# Patient Record
Sex: Female | Born: 1970 | Race: Black or African American | Hispanic: No | Smoking: Current every day smoker
Health system: Southern US, Community
[De-identification: ages and names within clinical notes are randomized; demographics above are authoritative.]

## PROBLEM LIST (undated history)

## (undated) DIAGNOSIS — I509 Heart failure, unspecified: Secondary | ICD-10-CM

## (undated) HISTORY — PX: CARDIAC SURGERY: SHX584

## (undated) HISTORY — PX: THYROIDECTOMY: SHX17

## (undated) HISTORY — PX: ABDOMINAL HYSTERECTOMY: SHX81

---

## 2010-04-04 DIAGNOSIS — I10 Essential (primary) hypertension: Secondary | ICD-10-CM | POA: Insufficient documentation

## 2010-04-04 HISTORY — DX: Essential (primary) hypertension: I10

## 2011-12-30 DIAGNOSIS — M8580 Other specified disorders of bone density and structure, unspecified site: Secondary | ICD-10-CM

## 2011-12-30 HISTORY — DX: Other specified disorders of bone density and structure, unspecified site: M85.80

## 2012-04-03 DIAGNOSIS — H349 Unspecified retinal vascular occlusion: Secondary | ICD-10-CM | POA: Insufficient documentation

## 2012-04-03 HISTORY — DX: Unspecified retinal vascular occlusion: H34.9

## 2014-07-14 DIAGNOSIS — I482 Chronic atrial fibrillation, unspecified: Secondary | ICD-10-CM | POA: Insufficient documentation

## 2014-07-14 HISTORY — DX: Chronic atrial fibrillation, unspecified: I48.20

## 2015-04-14 DIAGNOSIS — I428 Other cardiomyopathies: Secondary | ICD-10-CM | POA: Insufficient documentation

## 2015-04-14 HISTORY — DX: Other cardiomyopathies: I42.8

## 2019-01-21 DIAGNOSIS — M3214 Glomerular disease in systemic lupus erythematosus: Secondary | ICD-10-CM | POA: Insufficient documentation

## 2019-01-21 DIAGNOSIS — Z992 Dependence on renal dialysis: Secondary | ICD-10-CM | POA: Insufficient documentation

## 2019-01-21 DIAGNOSIS — N2581 Secondary hyperparathyroidism of renal origin: Secondary | ICD-10-CM

## 2019-01-21 DIAGNOSIS — L930 Discoid lupus erythematosus: Secondary | ICD-10-CM | POA: Insufficient documentation

## 2019-01-21 DIAGNOSIS — R52 Pain, unspecified: Secondary | ICD-10-CM

## 2019-01-21 DIAGNOSIS — L299 Pruritus, unspecified: Secondary | ICD-10-CM | POA: Insufficient documentation

## 2019-01-21 DIAGNOSIS — E559 Vitamin D deficiency, unspecified: Secondary | ICD-10-CM

## 2019-01-21 DIAGNOSIS — R0600 Dyspnea, unspecified: Secondary | ICD-10-CM | POA: Insufficient documentation

## 2019-01-21 DIAGNOSIS — N186 End stage renal disease: Secondary | ICD-10-CM

## 2019-01-21 DIAGNOSIS — I25810 Atherosclerosis of coronary artery bypass graft(s) without angina pectoris: Secondary | ICD-10-CM

## 2019-01-21 DIAGNOSIS — R197 Diarrhea, unspecified: Secondary | ICD-10-CM | POA: Insufficient documentation

## 2019-01-21 DIAGNOSIS — D631 Anemia in chronic kidney disease: Secondary | ICD-10-CM | POA: Insufficient documentation

## 2019-01-21 DIAGNOSIS — D509 Iron deficiency anemia, unspecified: Secondary | ICD-10-CM

## 2019-01-21 DIAGNOSIS — N250 Renal osteodystrophy: Secondary | ICD-10-CM | POA: Insufficient documentation

## 2019-01-21 DIAGNOSIS — I120 Hypertensive chronic kidney disease with stage 5 chronic kidney disease or end stage renal disease: Secondary | ICD-10-CM | POA: Insufficient documentation

## 2019-01-21 DIAGNOSIS — N189 Chronic kidney disease, unspecified: Secondary | ICD-10-CM | POA: Insufficient documentation

## 2019-01-21 HISTORY — DX: Pain, unspecified: R52

## 2019-01-21 HISTORY — DX: Secondary hyperparathyroidism of renal origin: N25.81

## 2019-01-21 HISTORY — DX: Pruritus, unspecified: L29.9

## 2019-01-21 HISTORY — DX: Renal osteodystrophy: N25.0

## 2019-01-21 HISTORY — DX: Disorder of phosphorus metabolism, unspecified: E83.30

## 2019-01-21 HISTORY — DX: Dyspnea, unspecified: R06.00

## 2019-01-21 HISTORY — DX: Discoid lupus erythematosus: L93.0

## 2019-01-21 HISTORY — DX: Dependence on renal dialysis: Z99.2

## 2019-01-21 HISTORY — DX: Vitamin D deficiency, unspecified: E55.9

## 2019-01-21 HISTORY — DX: Iron deficiency anemia, unspecified: D50.9

## 2019-01-21 HISTORY — DX: End stage renal disease: N18.6

## 2019-01-21 HISTORY — DX: Anemia in chronic kidney disease: N18.9

## 2019-01-21 HISTORY — DX: Atherosclerosis of coronary artery bypass graft(s) without angina pectoris: I25.810

## 2019-01-21 HISTORY — DX: Hypertensive chronic kidney disease with stage 5 chronic kidney disease or end stage renal disease: I12.0

## 2019-01-21 HISTORY — DX: Diarrhea, unspecified: R19.7

## 2019-01-21 HISTORY — DX: Anemia in chronic kidney disease: D63.1

## 2019-01-25 DIAGNOSIS — E875 Hyperkalemia: Secondary | ICD-10-CM | POA: Insufficient documentation

## 2019-01-25 HISTORY — DX: Hyperkalemia: E87.5

## 2019-04-01 ENCOUNTER — Ambulatory Visit: Payer: Medicare Other | Admitting: Cardiology

## 2019-04-06 ENCOUNTER — Ambulatory Visit: Payer: Medicare Other | Admitting: Cardiology

## 2019-04-13 ENCOUNTER — Encounter: Payer: Self-pay | Admitting: *Deleted

## 2019-04-13 ENCOUNTER — Ambulatory Visit (INDEPENDENT_AMBULATORY_CARE_PROVIDER_SITE_OTHER): Payer: Medicare Other | Admitting: Cardiology

## 2019-04-13 ENCOUNTER — Other Ambulatory Visit: Payer: Self-pay

## 2019-04-13 VITALS — BP 120/80 | HR 127 | Ht 69.0 in | Wt 181.0 lb

## 2019-04-13 DIAGNOSIS — M3214 Glomerular disease in systemic lupus erythematosus: Secondary | ICD-10-CM | POA: Insufficient documentation

## 2019-04-13 DIAGNOSIS — I482 Chronic atrial fibrillation, unspecified: Secondary | ICD-10-CM | POA: Diagnosis not present

## 2019-04-13 DIAGNOSIS — I10 Essential (primary) hypertension: Secondary | ICD-10-CM | POA: Diagnosis not present

## 2019-04-13 DIAGNOSIS — I050 Rheumatic mitral stenosis: Secondary | ICD-10-CM

## 2019-04-13 DIAGNOSIS — N186 End stage renal disease: Secondary | ICD-10-CM

## 2019-04-13 DIAGNOSIS — Z7901 Long term (current) use of anticoagulants: Secondary | ICD-10-CM | POA: Insufficient documentation

## 2019-04-13 HISTORY — DX: Glomerular disease in systemic lupus erythematosus: M32.14

## 2019-04-13 HISTORY — DX: Long term (current) use of anticoagulants: Z79.01

## 2019-04-13 HISTORY — DX: Rheumatic mitral stenosis: I05.0

## 2019-04-13 NOTE — Progress Notes (Signed)
Cardiology Office Note:    Date:  04/13/2019   ID:  Morgan Pope, DOB June 16, 1970, MRN [id number]  PCP:  [facility]  Cardiologist:  Berniece Salines, DO  Electrophysiologist:  None   Referring MD: Reita May, NP   Chief Complaint  Patient presents with  . Irregular Heart Beat  . Establish Care    History of Present Illness:    Morgan Pope is a 49 y.o. female with a hx of atrial fibrillation (diagnosed in 2017 patient is currently on warfarin and amiodarone 200 mg daily), nonischemic cardiomyopathy most recent ejection fraction in September 2019 was reported at rate of 55%, lupus which was diagnosed in her 31s, ESRD on hemodialysis Monday Wednesday and Fridays first dialysis was back in July 2000, hypertensive heart disease however patient is no longer antihypertensive due to significant hypotension postdialysis and now take midodrine.  The patient is here today to establish cardiovascular care.  She denies any chest pain, shortness of breath, nausea, vomiting.  She just moved to [place] and wants to establish care.  Past Medical History:  Diagnosis Date  . Anemia in chronic kidney disease 01/21/2019  . 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 Chronic atrial fibrillation ([place]) 07/14/2014  . Dependence on renal dialysis ([place]) 01/21/2019  . Diarrhea, unspecified 01/21/2019  . 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 Disorder of phosphorus metabolism, unspecified 01/21/2019  . Dyspnea, unspecified 01/21/2019  . 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 End stage renal disease ([place]) 01/21/2019  . Hyperkalemia 01/25/2019  . Hypertension 04/04/2010  . Hypertensive chronic kidney disease with stage 5 chronic kidney disease or end stage renal disease ([place]) 01/21/2019  . Iron deficiency anemia, unspecified 01/21/2019  . Nonischemic cardiomyopathy ([place]) 04/14/2015   EF 28%.  Followed by [employer].   EF improved to 50% with therapy.    . Osteopenia 12/30/2011   On bone density 9/13  . Pain, unspecified 01/21/2019  . Pruritus, unspecified 01/21/2019  . Renal osteodystrophy 01/21/2019  . Secondary hyperparathyroidism of renal origin ([place]) 01/21/2019  . SLE glomerulonephritis syndrome, WHO class VI ([place]) 04/13/2019  . Vitamin D deficiency, unspecified 01/21/2019    Past Surgical History:  Procedure Laterality Date  . ABDOMINAL HYSTERECTOMY    . CARDIAC SURGERY     Released fluid from heart  . THYROIDECTOMY      Current Medications: Current Meds  Medication Sig  . amiodarone (PACERONE) 200 MG tablet Take 200 mg by mouth 2 (two) times daily.  . calcitRIOL (ROCALTROL) 0.25 MCG capsule Take by mouth.  . Calcium Acetate 667 MG TABS TK 3 TS PO TID WITH MEALS AND TK 2 TS WITH SNACKS  . Cholecalciferol 50 MCG (2000 UT) TABS Take by mouth.  . fluticasone (FLONASE) 50 MCG/ACT nasal spray Place 2 sprays into both nostrils 2 (two) times daily.  . midodrine (PROAMATINE) 10 MG tablet Take 10 mg by mouth 3 (three) times a week.  Marland Kitchen omeprazole (PRILOSEC) 20 MG capsule Take 20 mg by mouth daily.  Marland Kitchen warfarin (COUMADIN) 5 MG tablet Take 5 mg by mouth daily.     Allergies:   Codeine, Penicillins, and Fentanyl   Social History   Socioeconomic History  . Marital status: Divorced    Spouse name: Not on file  . Number of children: Not on file  . Years of education: Not on file  . Highest education level: Not on file  Occupational History  . Not on file  Tobacco Use  .  Smoking status: Current Every Day Smoker    Types: Cigarettes  . Smokeless tobacco: Never Used  Substance and Sexual Activity  . Alcohol use: Not Currently  . Drug use: Never  . Sexual activity: Not on file  Other Topics Concern  . Not on file  Social History Narrative  . Not on file   Social Determinants of Health   Financial Resource Strain:   . Difficulty of Paying Living Expenses: Not on file  Food Insecurity:   . Worried About Charity fundraiser in  the Last Year: Not on file  . Ran Out of Food in the Last Year: Not on file  Transportation Needs:   . Lack of Transportation (Medical): Not on file  . Lack of Transportation (Non-Medical): Not on file  Physical Activity:   . Days of Exercise per Week: Not on file  . Minutes of Exercise per Session: Not on file  Stress:   . Feeling of Stress : Not on file  Social Connections:   . Frequency of Communication with Friends and Family: Not on file  . Frequency of Social Gatherings with Friends and Family: Not on file  . Attends Religious Services: Not on file  . Active Member of Clubs or Organizations: Not on file  . Attends Archivist Meetings: Not on file  . Marital Status: Not on file     Family History: The patient's family history includes Congestive Heart Failure in her mother; Dementia in her mother; Diabetes in her mother and sister; Heart attack in her brother; Renal Disease in her mother and sister.  ROS:   Review of Systems  Constitution: Negative for decreased appetite, fever and weight gain.  HENT: Negative for congestion, ear discharge, hoarse voice and sore throat.   Eyes: Negative for discharge, redness, vision loss in right eye and visual halos.  Cardiovascular: Negative for chest pain, dyspnea on exertion, leg swelling, orthopnea and palpitations.  Respiratory: Negative for cough, hemoptysis, shortness of breath and snoring.   Endocrine: Negative for heat intolerance and polyphagia.  Hematologic/Lymphatic: Negative for bleeding problem. Does not bruise/bleed easily.  Skin: Negative for flushing, nail changes, rash and suspicious lesions.  Musculoskeletal: Negative for arthritis, joint pain, muscle cramps, myalgias, neck pain and stiffness.  Gastrointestinal: Negative for abdominal pain, bowel incontinence, diarrhea and excessive appetite.  Genitourinary: Negative for decreased libido, genital sores and incomplete emptying.  Neurological: Negative for brief  paralysis, focal weakness, headaches and loss of balance.  Psychiatric/Behavioral: Negative for altered mental status, depression and suicidal ideas.  Allergic/Immunologic: Negative for HIV exposure and persistent infections.    EKGs/Labs/Other Studies Reviewed:    The following studies were reviewed today:   EKG:  The ekg ordered today demonstrates tachycardia, heart rate 127 bpm.  Echocardiogram done in 2019 reported mild concentric LVH.  Mild LV systolic function.  EF 55%.  Severely dilated left atrium.  Calcified mitral valve and MAC with mild mitral stenosis grading 4 mmHg.  Sclerotic mitral valve.  Mild aortic regurgitation.  Recent Labs: No results found for requested labs within last 8760 hours.  Recent Lipid Panel No results found for: CHOL, TRIG, HDL, CHOLHDL, VLDL, LDLCALC, LDLDIRECT  Physical Exam:    VS:  BP 120/80 (BP Location: Right Arm, Patient Position: Sitting, Cuff Size: Normal)   Pulse (!) 127   Ht _0  (1.753 m)   Wt 181 lb (82.1 kg)   SpO2 98%   BMI 26.73 kg/m     Wt Readings from  Last 3 Encounters:  04/13/19 181 lb (82.1 kg)     GEN: Well nourished, well developed in no acute distress HEENT: Normal NECK: No JVD; No carotid bruits  LYMPHATICS: No lymphadenopathy CARDIAC: S1S2 noted,RRR, no murmurs, rubs, gallops RESPIRATORY:  Clear to auscultation without rales, wheezing or rhonchi  ABDOMEN: Soft, non-tender, non-distended, +bowel sounds, no guarding. EXTREMITIES: No edema, No cyanosis, no clubbing MUSCULOSKELETAL:  No deformity  SKIN: Warm and dry NEUROLOGIC:  Alert and oriented x 3, non-focal PSYCHIATRIC:  Normal affect, good insight  ASSESSMENT:    1. Chronic atrial fibrillation ([place])   2. Hypertension, unspecified type   3. Anticoagulant long-term use   4. Mitral valve stenosis, unspecified etiology   5. ESRD (end stage renal disease) ([place])    PLAN:    1.  In effort to establish cardiovascular care we are going to get new baseline  echocardiogram especially since her 2019 echocardiogram to report notes mild mitral stenosis.   2.  She is on Coumadin for her atrial fibrillation and has not really been monitored appropriately therefore we can set the patient up with our Coumadin clinic. She is agreeable to this.   3.  No medication changes will be made at this time.  The patient is in agreement with the above plan. The patient left the office in stable condition.  The patient will follow up in 3 months or sooner if needed.  Medication Adjustments/Labs and Tests Ordered: Current medicines are reviewed at length with the patient today.  Concerns regarding medicines are outlined above.  Orders Placed This Encounter  Procedures  . Ambulatory referral to Anticoagulation Monitoring  . ECHOCARDIOGRAM COMPLETE   No orders of the defined types were placed in this encounter.   Patient Instructions  Medication Instructions:  Your physician recommends that you continue on your current medications as directed. Please refer to the Current Medication list given to you today.  *If you need a refill on your cardiac medications before your next appointment, please call your pharmacy*  Lab Work: None If you have labs (blood work) drawn today and your tests are completely normal, you will receive your results only by: Marland Kitchen MyChart Message (if you have MyChart) OR . A paper copy in the mail If you have any lab test that is abnormal or we need to change your treatment, we will call you to review the results.  Testing/Procedures: Your physician has requested that you have an echocardiogram. Echocardiography is a painless test that uses sound waves to create images of your heart. It provides your doctor with information about the size and shape of your heart and how well your heart's chambers and valves are working. This procedure takes approximately one hour. There are no restrictions for this procedure.    Follow-Up: At [facility], you and your health needs are our priority.  As part of our continuing mission to provide you with exceptional heart care, we have created designated Provider Care Teams.  These Care Teams include your primary Cardiologist (physician) and Advanced Practice Providers (APPs -  Physician Assistants and Nurse Practitioners) who all work together to provide you with the care you need, when you need it.  Your next appointment:   3 month(s)  The format for your next appointment:   In Person  Provider:   Berniece Salines, DO  Other Instructions      Adopting a Healthy Lifestyle.  Know what a healthy weight is for you (roughly BMI <25) and aim to  maintain this   Aim for 7+ servings of fruits and vegetables daily   65-80+ fluid ounces of water or unsweet tea for healthy kidneys   Limit to max 1 drink of alcohol per day; avoid smoking/tobacco   Limit animal fats in diet for cholesterol and heart health - choose grass fed whenever available   Avoid highly processed foods, and foods high in saturated/trans fats   Aim for low stress - take time to unwind and care for your mental health   Aim for 150 min of moderate intensity exercise weekly for heart health, and weights twice weekly for bone health   Aim for 7-9 hours of sleep daily   When it comes to diets, agreement about the perfect plan isnt easy to find, even among the experts. Experts at the [place] developed an idea known as the Healthy Eating Plate. Just imagine a plate divided into logical, healthy portions.   The emphasis is on diet quality:   Load up on vegetables and fruits - one-half of your plate: Aim for color and variety, and remember that potatoes dont count.   Go for whole grains - one-quarter of your plate: Whole wheat, barley, wheat berries, quinoa, oats, brown rice, and foods made with them. If you want pasta, go with whole wheat pasta.   Protein power - one-quarter of your plate:  Fish, chicken, beans, and nuts are all healthy, versatile protein sources. Limit red meat.   The diet, however, does go beyond the plate, offering a few other suggestions.   Use healthy plant oils, such as olive, canola, soy, corn, sunflower and peanut. Check the labels, and avoid partially hydrogenated oil, which have unhealthy trans fats.   If youre thirsty, drink water. Coffee and tea are good in moderation, but skip sugary drinks and limit milk and dairy products to one or two daily servings.   The type of carbohydrate in the diet is more important than the amount. Some sources of carbohydrates, such as vegetables, fruits, whole grains, and beans-are healthier than others.   Finally, stay active  Signed, Berniece Salines, DO  04/13/2019 11:09 AM    [place]

## 2019-04-13 NOTE — Addendum Note (Signed)
Addended by: Particia Nearing B on: 04/13/2019 01:52 PM   Modules accepted: Orders

## 2019-04-13 NOTE — Patient Instructions (Addendum)
Medication Instructions:  Your physician recommends that you continue on your current medications as directed. Please refer to the Current Medication list given to you today.  *If you need a refill on your cardiac medications before your next appointment, please call your pharmacy*  Lab Work: None If you have labs (blood work) drawn today and your tests are completely normal, you will receive your results only by: Marland Kitchen MyChart Message (if you have MyChart) OR . A paper copy in the mail If you have any lab test that is abnormal or we need to change your treatment, we will call you to review the results.  Testing/Procedures: Your physician has requested that you have an echocardiogram. Echocardiography is a painless test that uses sound waves to create images of your heart. It provides your doctor with information about the size and shape of your heart and how well your heart's chambers and valves are working. This procedure takes approximately one hour. There are no restrictions for this procedure.    Follow-Up: At [facility], you and your health needs are our priority.  As part of our continuing mission to provide you with exceptional heart care, we have created designated Provider Care Teams.  These Care Teams include your primary Cardiologist (physician) and Advanced Practice Providers (APPs -  Physician Assistants and Nurse Practitioners) who all work together to provide you with the care you need, when you need it.  Your next appointment:   3 month(s)  The format for your next appointment:   In Person  Provider:   Berniece Salines, DO  Other Instructions You are being referred to the Coumadin clinic. They will contact you with an appointment date and time.

## 2019-04-23 ENCOUNTER — Emergency Department (HOSPITAL_COMMUNITY): Payer: Medicare Other

## 2019-04-23 ENCOUNTER — Encounter (HOSPITAL_COMMUNITY): Payer: Self-pay | Admitting: *Deleted

## 2019-04-23 ENCOUNTER — Other Ambulatory Visit: Payer: Self-pay

## 2019-04-23 ENCOUNTER — Inpatient Hospital Stay (HOSPITAL_COMMUNITY)
Admission: EM | Admit: 2019-04-23 | Discharge: 2019-04-25 | DRG: 314 | Disposition: A | Discharge status: Home / Self Care | Payer: Medicare Other | Attending: Internal Medicine | Admitting: Internal Medicine

## 2019-04-23 DIAGNOSIS — M79602 Pain in left arm: Secondary | ICD-10-CM | POA: Diagnosis present

## 2019-04-23 DIAGNOSIS — I10 Essential (primary) hypertension: Secondary | ICD-10-CM | POA: Diagnosis present

## 2019-04-23 DIAGNOSIS — N2581 Secondary hyperparathyroidism of renal origin: Secondary | ICD-10-CM | POA: Diagnosis present

## 2019-04-23 DIAGNOSIS — D631 Anemia in chronic kidney disease: Secondary | ICD-10-CM | POA: Diagnosis present

## 2019-04-23 DIAGNOSIS — Z841 Family history of disorders of kidney and ureter: Secondary | ICD-10-CM

## 2019-04-23 DIAGNOSIS — M8580 Other specified disorders of bone density and structure, unspecified site: Secondary | ICD-10-CM | POA: Diagnosis present

## 2019-04-23 DIAGNOSIS — I482 Chronic atrial fibrillation, unspecified: Secondary | ICD-10-CM | POA: Diagnosis present

## 2019-04-23 DIAGNOSIS — T827XXA Infection and inflammatory reaction due to other cardiac and vascular devices, implants and grafts, initial encounter: Principal | ICD-10-CM | POA: Diagnosis present

## 2019-04-23 DIAGNOSIS — T82898A Other specified complication of vascular prosthetic devices, implants and grafts, initial encounter: Secondary | ICD-10-CM | POA: Diagnosis present

## 2019-04-23 DIAGNOSIS — Z8249 Family history of ischemic heart disease and other diseases of the circulatory system: Secondary | ICD-10-CM

## 2019-04-23 DIAGNOSIS — Z20822 Contact with and (suspected) exposure to covid-19: Secondary | ICD-10-CM | POA: Diagnosis present

## 2019-04-23 DIAGNOSIS — L0291 Cutaneous abscess, unspecified: Secondary | ICD-10-CM

## 2019-04-23 DIAGNOSIS — Y832 Surgical operation with anastomosis, bypass or graft as the cause of abnormal reaction of the patient, or of later complication, without mention of misadventure at the time of the procedure: Secondary | ICD-10-CM | POA: Diagnosis present

## 2019-04-23 DIAGNOSIS — Z885 Allergy status to narcotic agent status: Secondary | ICD-10-CM

## 2019-04-23 DIAGNOSIS — I428 Other cardiomyopathies: Secondary | ICD-10-CM

## 2019-04-23 DIAGNOSIS — Z880 Allergy status to penicillin: Secondary | ICD-10-CM

## 2019-04-23 DIAGNOSIS — Z7901 Long term (current) use of anticoagulants: Secondary | ICD-10-CM

## 2019-04-23 DIAGNOSIS — Z79899 Other long term (current) drug therapy: Secondary | ICD-10-CM

## 2019-04-23 DIAGNOSIS — I25810 Atherosclerosis of coronary artery bypass graft(s) without angina pectoris: Secondary | ICD-10-CM | POA: Diagnosis present

## 2019-04-23 DIAGNOSIS — F17210 Nicotine dependence, cigarettes, uncomplicated: Secondary | ICD-10-CM | POA: Diagnosis present

## 2019-04-23 DIAGNOSIS — M3214 Glomerular disease in systemic lupus erythematosus: Secondary | ICD-10-CM | POA: Diagnosis present

## 2019-04-23 DIAGNOSIS — Z992 Dependence on renal dialysis: Secondary | ICD-10-CM

## 2019-04-23 DIAGNOSIS — L02414 Cutaneous abscess of left upper limb: Secondary | ICD-10-CM | POA: Diagnosis present

## 2019-04-23 DIAGNOSIS — N186 End stage renal disease: Secondary | ICD-10-CM | POA: Diagnosis present

## 2019-04-23 HISTORY — DX: Other specified complication of vascular prosthetic devices, implants and grafts, initial encounter: T82.898A

## 2019-04-23 HISTORY — DX: Pain in left arm: M79.602

## 2019-04-23 HISTORY — DX: Infection and inflammatory reaction due to other cardiac and vascular devices, implants and grafts, initial encounter: T82.7XXA

## 2019-04-23 HISTORY — DX: Cutaneous abscess, unspecified: L02.91

## 2019-04-23 LAB — COMPREHENSIVE METABOLIC PANEL
ALT: 7 U/L (ref 0–44)
AST: 12 U/L — ABNORMAL LOW (ref 15–41)
Albumin: 3.5 g/dL (ref 3.5–5.0)
Alkaline Phosphatase: 62 U/L (ref 38–126)
Anion gap: 18 — ABNORMAL HIGH (ref 5–15)
BUN: 51 mg/dL — ABNORMAL HIGH (ref 6–20)
CO2: 24 mmol/L (ref 22–32)
Calcium: 8.7 mg/dL — ABNORMAL LOW (ref 8.9–10.3)
Chloride: 93 mmol/L — ABNORMAL LOW (ref 98–111)
Creatinine, Ser: 11.42 mg/dL — ABNORMAL HIGH (ref 0.44–1.00)
GFR calc Af Amer: 4 mL/min — ABNORMAL LOW (ref >60)
GFR calc non Af Amer: 3 mL/min — ABNORMAL LOW (ref >60)
Glucose, Bld: 81 mg/dL (ref 70–99)
Potassium: 5.7 mmol/L — ABNORMAL HIGH (ref 3.5–5.1)
Sodium: 135 mmol/L (ref 135–145)
Total Bilirubin: 0.9 mg/dL (ref 0.3–1.2)
Total Protein: 7.1 g/dL (ref 6.5–8.1)

## 2019-04-23 LAB — CBC WITH DIFFERENTIAL/PLATELET
Abs Immature Granulocytes: 0.01 10*3/uL (ref 0.00–0.07)
Basophils Absolute: 0 10*3/uL (ref 0.0–0.1)
Basophils Relative: 0 %
Eosinophils Absolute: 0 10*3/uL (ref 0.0–0.5)
Eosinophils Relative: 1 %
HCT: 44.3 % (ref 36.0–46.0)
Hemoglobin: 13.3 g/dL (ref 12.0–15.0)
Immature Granulocytes: 0 %
Lymphocytes Relative: 33 %
Lymphs Abs: 1.5 10*3/uL (ref 0.7–4.0)
MCH: 30.2 pg (ref 26.0–34.0)
MCHC: 30 g/dL (ref 30.0–36.0)
MCV: 100.5 fL — ABNORMAL HIGH (ref 80.0–100.0)
Monocytes Absolute: 0.5 10*3/uL (ref 0.1–1.0)
Monocytes Relative: 11 %
Neutro Abs: 2.6 10*3/uL (ref 1.7–7.7)
Neutrophils Relative %: 55 %
Platelets: 113 10*3/uL — ABNORMAL LOW (ref 150–400)
RBC: 4.41 MIL/uL (ref 3.87–5.11)
RDW: 15.9 % — ABNORMAL HIGH (ref 11.5–15.5)
WBC: 4.7 10*3/uL (ref 4.0–10.5)
nRBC: 0 % (ref 0.0–0.2)

## 2019-04-23 LAB — RESPIRATORY PANEL BY RT PCR (FLU A&B, COVID)
Influenza A by PCR: NEGATIVE
Influenza B by PCR: NEGATIVE
SARS Coronavirus 2 by RT PCR: NEGATIVE

## 2019-04-23 LAB — PROTIME-INR
INR: 1.8 — ABNORMAL HIGH (ref 0.8–1.2)
Prothrombin Time: 20.4 seconds — ABNORMAL HIGH (ref 11.4–15.2)

## 2019-04-23 LAB — LACTIC ACID, PLASMA: Lactic Acid, Venous: 1.2 mmol/L (ref 0.5–1.9)

## 2019-04-23 MED ORDER — VANCOMYCIN HCL 10 G IV SOLR
2000.0000 mg | Freq: Once | INTRAVENOUS | Status: AC
  Administered 2019-04-23: 2000 mg via INTRAVENOUS
  Filled 2019-04-23: qty 2000

## 2019-04-23 MED ORDER — SODIUM ZIRCONIUM CYCLOSILICATE 5 G PO PACK
5.0000 g | PACK | Freq: Once | ORAL | Status: AC
  Administered 2019-04-24: 5 g via ORAL
  Filled 2019-04-23 (×2): qty 1

## 2019-04-23 MED ORDER — HYDRALAZINE HCL 20 MG/ML IJ SOLN: 10.0000 mg | INTRAMUSCULAR | Status: DC | PRN

## 2019-04-23 NOTE — ED Notes (Signed)
Able to draw limited amount of blood from IV and 2nd straight stick. EDP aware of delay for remaining blood tubes, phlebotomy assistance requested.

## 2019-04-23 NOTE — Progress Notes (Signed)
New Admission Note:   Arrival Method: Arrived from ED via stretcher Mental Orientation: Alert and oriented x4 Telemetry: Box #13 Assessment: Completed Skin: Intact IV: NSL-Rt Hand Pain: Denies Tubes: N/A Safety Measures: Safety Fall Prevention Plan has been discussed.  Admission: Completed 5MW Orientation: Patient has been orientated to the room, unit and staff.  Family: None at bedside  Orders have been reviewed and implemented. Will continue to monitor the patient. Call light has been placed within reach and bed alarm has been activated.   Morgan Pope [employer], RN-BC Phone number: [phone]

## 2019-04-23 NOTE — ED Triage Notes (Signed)
C/o pain in her left graft, states she  Last had dialysis on Wed. , States her arm started hurting on wed. Afternoon, went to dialysis today and they weren't able to do dialysis today.

## 2019-04-23 NOTE — Progress Notes (Signed)
Received report from ED RN. Room ready for patient. Dayjah Selman Joselita, RN 

## 2019-04-23 NOTE — Progress Notes (Signed)
Pharmacy Antibiotic Note  Morgan Pope is a 49 y.o. female admitted on 04/23/2019 with cellulitis, infected fistula.  Pharmacy has been consulted for vancomycin dosing.  ESRD - HD MWF, last HD on Wed, was not able to get HD today.    Plan: Vancomycin 2000 mg IV x 1, then f/u HD schedule for 1000 mg IV each HD Monitor HD schedule, Cx and clinical progression to narrow Vancomycin random level as needed F/u need for gram negative coverage   Height: 5\' 9"  (175.3 cm) Weight: 181 lb (82.1 kg) IBW/kg (Calculated) : 66.2  Temp (24hrs), Avg:97.9 F (36.6 C), Min:97.7 F (36.5 C), Max:98.1 F (36.7 C)  Recent Labs  Lab 04/23/19 1930 04/23/19 1938  WBC 4.7  --   CREATININE 11.42*  --   LATICACIDVEN  --  1.2    Estimated Creatinine Clearance: 6.8 mL/min (A) (by C-G formula based on SCr of 11.42 mg/dL (H)).    Allergies  Allergen Reactions  . Codeine Rash    Other reaction(s): Unknown Itchy rash   . Penicillins     Other reaction(s): Unknown  . Fentanyl Nausea And Vomiting    Bertis Ruddy, PharmD Clinical Pharmacist Please check AMION for all [place] numbers 04/23/2019 9:57 PM

## 2019-04-23 NOTE — ED Notes (Addendum)
Thrill and bruit present in fistula LUE, states this is her 3rd acces site, has been receiving dialysis for 48yrs. This is first encounter for pain and concern for infxn to this site. Pt denies drainage or bleeding from site.

## 2019-04-23 NOTE — ED Notes (Signed)
Patient transported to Ultrasound 

## 2019-04-23 NOTE — ED Provider Notes (Signed)
[place] EMERGENCY DEPARTMENT Provider Note   CSN: [id number] Arrival date & time: 04/23/19  1242     History Chief Complaint  Patient presents with  . Arm Problem    Morgan Pope is a 49 y.o. female with past medical history significant for ESRD, nonischemic cardiomyopathy, chronic A. fib on Coumadin presents to emergency department today with chief complaint of left arm pain x3 days.  Patient dialyzes at [facility] M/W/F. She went to dialysis Wednesday and had her full session.  She states later that night she had pain near her fistula, describing as a tenderness she states it was warm to the touch and she noticed some surrounding redness.  She applied ice which helps with pain and swelling.  She denies any purulent drainage or fevers.  She states she went to dialysis today but was unable to be dialyzed because did not want to access the fistula given the pain and possible infection.  She denies fever, chills, chest pain, shortness of breath, abdominal pain, nausea, vomiting, diarrhea. She is not currently taking steroids or immunosuppression agents.  She states she has had this fistula for 8 years.  5 lo kelma, tonight, NPO,   Past Medical History:  Diagnosis Date  . Anemia in chronic kidney disease 01/21/2019  . 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 Chronic atrial fibrillation ([place]) 07/14/2014  . Dependence on renal dialysis ([place]) 01/21/2019  . Diarrhea, unspecified 01/21/2019  . 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 Disorder of phosphorus metabolism, unspecified 01/21/2019  . Dyspnea, unspecified 01/21/2019  . 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 End stage renal disease ([place]) 01/21/2019  . Hyperkalemia 01/25/2019  . Hypertension 04/04/2010  . Hypertensive chronic kidney disease with stage 5 chronic kidney disease or end stage renal disease ([place]) 01/21/2019  . Iron deficiency anemia, unspecified  01/21/2019  . Nonischemic cardiomyopathy ([place]) 04/14/2015   EF 28%.  Followed by [employer].   EF improved to 50% with therapy.  . Osteopenia 12/30/2011   On bone density 9/13  . Pain, unspecified 01/21/2019  . Pruritus, unspecified 01/21/2019  . Renal osteodystrophy 01/21/2019  . Secondary hyperparathyroidism of renal origin ([place]) 01/21/2019  . SLE glomerulonephritis syndrome, WHO class VI ([place]) 04/13/2019  . Vitamin D deficiency, unspecified 01/21/2019    Patient Active Problem List   Diagnosis Date Noted  . SLE glomerulonephritis syndrome, WHO class VI ([place]) 04/13/2019  . Anticoagulant long-term use 04/13/2019  . Mitral valve stenosis 04/13/2019  . Hyperkalemia 01/25/2019  . Anemia in chronic kidney disease 01/21/2019  . 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 Dependence on renal dialysis ([place]) 01/21/2019  . Diarrhea, unspecified 01/21/2019  . 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 Disorder of phosphorus metabolism, unspecified 01/21/2019  . Dyspnea, unspecified 01/21/2019  . ESRD (end stage renal disease) ([place]) 01/21/2019  . Hypertensive chronic kidney disease with stage 5 chronic kidney disease or end stage renal disease ([place]) 01/21/2019  . Iron deficiency anemia, unspecified 01/21/2019  . Pain, unspecified 01/21/2019  . Pruritus, unspecified 01/21/2019  . Renal osteodystrophy 01/21/2019  . Secondary hyperparathyroidism of renal origin ([place]) 01/21/2019  . Vitamin D deficiency, unspecified 01/21/2019  . Nonischemic cardiomyopathy ([place]) 04/14/2015  . Chronic atrial fibrillation ([place]) 07/14/2014  . 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 Osteopenia 12/30/2011  . Hypertension 04/04/2010    Past Surgical History:  Procedure Laterality Date  . ABDOMINAL HYSTERECTOMY    . CARDIAC SURGERY  Released fluid from heart  . THYROIDECTOMY       OB History   No obstetric history on file.     Family History  Problem  Relation Age of Onset  . Congestive Heart Failure Mother   . Diabetes Mother   . Renal Disease Mother   . Dementia Mother   . Diabetes Sister   . Renal Disease Sister   . Heart attack Brother     Social History   Tobacco Use  . Smoking status: Current Every Day Smoker    Types: Cigarettes  . Smokeless tobacco: Never Used  Substance Use Topics  . Alcohol use: Not Currently  . Drug use: Never    Home Medications Prior to Admission medications   Medication Sig Start Date End Date Taking? Authorizing Provider  amiodarone (PACERONE) 200 MG tablet Take 200 mg by mouth 2 (two) times daily. 07/22/18  Yes [provider]  calcitRIOL (ROCALTROL) 0.25 MCG capsule Take 0.25 mcg by mouth daily.    Yes [provider]  Calcium Acetate 667 MG TABS Take 2,001 mg by mouth 3 (three) times daily with meals. And take 1334mg  with snacks 12/22/18  Yes [provider]  Cholecalciferol 50 MCG (2000 UT) TABS Take 2,000 Units by mouth daily.  01/25/19  Yes [provider]  fluticasone (FLONASE) 50 MCG/ACT nasal spray Place 2 sprays into both nostrils 2 (two) times daily. 01/14/19  Yes [provider]  midodrine (PROAMATINE) 10 MG tablet Take 10 mg by mouth 3 (three) times a week. Monday, Wednesday, Friday 04/02/19  Yes [provider]  omeprazole (PRILOSEC) 20 MG capsule Take 20 mg by mouth daily. 12/19/18  Yes [provider]  warfarin (COUMADIN) 5 MG tablet Take 5 mg by mouth daily. 03/23/19  Yes [provider]    Allergies    Codeine, Penicillins, and Fentanyl  Review of Systems   Review of Systems All other systems are reviewed and are negative for acute change except as noted in the HPI.  Physical Exam Updated Vital Signs BP 107/79   Pulse 72   Temp 98.1 F (36.7 C) (Oral)   Resp 15   Ht 5\' 9"  (1.753 m)   Wt 82.1 kg   SpO2 98%   BMI 26.73 kg/m   Physical Exam Vitals and nursing note reviewed.  Constitutional:       General: She is not in acute distress.    Appearance: She is not ill-appearing.  HENT:     Head: Normocephalic and atraumatic.     Right Ear: Tympanic membrane and external ear normal.     Left Ear: Tympanic membrane and external ear normal.     Nose: Nose normal.     Mouth/Throat:     Mouth: Mucous membranes are moist.     Pharynx: Oropharynx is clear.  Eyes:     General: No scleral icterus.       Right eye: No discharge.        Left eye: No discharge.     Extraocular Movements: Extraocular movements intact.     Conjunctiva/sclera: Conjunctivae normal.     Pupils: Pupils are equal, round, and reactive to light.  Neck:     Vascular: No JVD.  Cardiovascular:     Rate and Rhythm: Normal rate and regular rhythm.     Pulses: Normal pulses.          Radial pulses are 2+ on the right side and 2+ on the left side.  Heart sounds: Normal heart sounds.  Pulmonary:     Comments: Lungs clear to auscultation in all fields. Symmetric chest rise. No wheezing, rales, or rhonchi. Abdominal:     Comments: Abdomen is soft, non-distended, and non-tender in all quadrants. No rigidity, no guarding. No peritoneal signs.  Musculoskeletal:        General: Normal range of motion.     Cervical back: Normal range of motion.  Skin:    General: Skin is warm and dry.     Capillary Refill: Capillary refill takes less than 2 seconds.     Comments: Please see media below. Palpable thrill. There is surrounding erythema to fistula on proximal aspect of arm. Warm to the touch. Tender to palpation. No drainage noted  Neurological:     Mental Status: She is oriented to person, place, and time.     GCS: GCS eye subscore is 4. GCS verbal subscore is 5. GCS motor subscore is 6.     Comments: Fluent speech, no facial droop.  Psychiatric:        Behavior: Behavior normal.         ED Results / Procedures / Treatments   Labs (all labs ordered are listed, but only abnormal results are displayed) Labs Reviewed   CBC WITH DIFFERENTIAL/PLATELET - Abnormal; Notable for the following components:      Result Value   MCV 100.5 (*)    RDW 15.9 (*)    Platelets 113 (*)    All other components within normal limits  COMPREHENSIVE METABOLIC PANEL - Abnormal; Notable for the following components:   Potassium 5.7 (*)    Chloride 93 (*)    BUN 51 (*)    Creatinine, Ser 11.42 (*)    Calcium 8.7 (*)    AST 12 (*)    GFR calc non Af Amer 3 (*)    GFR calc Af Amer 4 (*)    Anion gap 18 (*)    All other components within normal limits  PROTIME-INR - Abnormal; Notable for the following components:   Prothrombin Time 20.4 (*)    INR 1.8 (*)    All other components within normal limits  RESPIRATORY PANEL BY RT PCR (FLU A&B, COVID)  CULTURE, BLOOD (ROUTINE X 2)  CULTURE, BLOOD (ROUTINE X 2)  LACTIC ACID, PLASMA    EKG EKG Interpretation  Date/Time:  Friday April 23 2019 19:40:07 EST Ventricular Rate:  113 PR Interval:    QRS Duration: 111 QT Interval:  380 QTC Calculation: 521 R Axis:   100 Text Interpretation: Sinus or ectopic atrial tachycardia Right axis deviation Nonspecific T abnormalities, lateral leads Prolonged QT interval Confirmed by Davonna Belling [phone]) on 04/23/2019 9:34:43 PM   Radiology DG Chest Portable 1 View  Result Date: 04/23/2019 CLINICAL DATA:  Missed dialysis EXAM: PORTABLE CHEST 1 VIEW COMPARISON:  None FINDINGS: Hazy reticular opacities throughout the lungs with fissural and septal thickening as well as cephalized, indistinct pulmonary vascularity. No consolidative opacity. No pneumothorax or effusion. Mild cardiomegaly with a calcified aorta. Surgical clips noted at the base of the right neck and upper mediastinum. Telemetry leads overlie the chest. No acute osseous or soft tissue abnormality. IMPRESSION: Features compatible with volume overload with mild-to-moderate interstitial edema and cardiomegaly. Aortic Atherosclerosis (ICD10-I70.0). Electronically Signed   By: Lovena Le M.D.   On: 04/23/2019 21:58    Procedures Procedures (including critical care time)  Medications Ordered in ED Medications  sodium zirconium cyclosilicate (LOKELMA) packet 5 g (  has no administration in time range)  vancomycin (VANCOCIN) 2,000 mg in sodium chloride 0.9 % 500 mL IVPB (has no administration in time range)    ED Course  I have reviewed the triage vital signs and the nursing notes.  Pertinent labs & imaging results that were available during my care of the patient were reviewed by me and considered in my medical decision making (see chart for details).    MDM Rules/Calculators/A&P                      Patient seen and examined. Patient presents awake, alert, hemodynamically stable, afebrile, non toxic.  On exam her lungs are clear to auscultation in all fields, no abdominal tenderness, no peritoneal signs, no obvious signs of volume overload on exam.  She does have redness and swelling noted to her fistula in left arm with tenderness to palpation. No drainage noted, no obvious abscess felt. Palpable thrill.   Labs today show no leukocytosis, no anemia.  Does have thrombocytopenia with platelet count of 113.  No prior to compare.  CMP with hyperkalemia 5.7, chloride 93, BUN/creatinine elevated at 51/11.42, again no prior to compare this to, unsure of her baseline.  Also with elevated anion gap of 18.  Lactic acid is within normal range.  INR subtherapeutic at 1.8.  EKG without STEMI.  Covid PCR and influenza tests are negative. Blood cultures pending. Chest x-ray does show signs of mild-to moderate volume overload with cardiomegaly and interstitial edema. Findings and plan of care discussed with supervising physician Dr. Alvino Chapel who agrees with plan to admit.  Consulted on-call nephrologist Dr. Johnney Ou who recommends: covering for possible infection with IV vancomycin to cover for infection, 5 mg Lokelma, NPO after midnight and hold coumadin. Also obtain vascular and soft  tissue [place] of fistulas. There is no vascular US overnight, so cannot obtain at time of admission. Soft tissue US pending at time of disposition. Nephrology will evaluate patient tomorrow. Unassigned admission. Spoke with Dr. Hal Hope with hospitalist service who agrees to assume care of patient and bring into the hospital for further evaluation and management.     Portions of this note were generated with [employer]. Dictation errors may occur despite best attempts at proofreading.   Final Clinical Impression(s) / ED Diagnoses Final diagnoses:  Abscess  Infection of arteriovenous dialysis fistula, initial encounter [facility])    Rx / [place] Orders ED Discharge Orders    None       Flint Melter 04/23/19 [id number]    Davonna Belling, MD 04/26/19 [phone]

## 2019-04-24 ENCOUNTER — Encounter (HOSPITAL_COMMUNITY): Payer: Self-pay | Admitting: Internal Medicine

## 2019-04-24 ENCOUNTER — Other Ambulatory Visit: Payer: Self-pay

## 2019-04-24 DIAGNOSIS — Z841 Family history of disorders of kidney and ureter: Secondary | ICD-10-CM | POA: Diagnosis not present

## 2019-04-24 DIAGNOSIS — F17210 Nicotine dependence, cigarettes, uncomplicated: Secondary | ICD-10-CM | POA: Diagnosis present

## 2019-04-24 DIAGNOSIS — M8580 Other specified disorders of bone density and structure, unspecified site: Secondary | ICD-10-CM | POA: Diagnosis present

## 2019-04-24 DIAGNOSIS — Z79899 Other long term (current) drug therapy: Secondary | ICD-10-CM | POA: Diagnosis not present

## 2019-04-24 DIAGNOSIS — I482 Chronic atrial fibrillation, unspecified: Secondary | ICD-10-CM

## 2019-04-24 DIAGNOSIS — I428 Other cardiomyopathies: Secondary | ICD-10-CM | POA: Diagnosis present

## 2019-04-24 DIAGNOSIS — Z20822 Contact with and (suspected) exposure to covid-19: Secondary | ICD-10-CM | POA: Diagnosis present

## 2019-04-24 DIAGNOSIS — D631 Anemia in chronic kidney disease: Secondary | ICD-10-CM | POA: Diagnosis present

## 2019-04-24 DIAGNOSIS — T827XXA Infection and inflammatory reaction due to other cardiac and vascular devices, implants and grafts, initial encounter: Secondary | ICD-10-CM | POA: Diagnosis present

## 2019-04-24 DIAGNOSIS — L0291 Cutaneous abscess, unspecified: Secondary | ICD-10-CM | POA: Diagnosis present

## 2019-04-24 DIAGNOSIS — Z8249 Family history of ischemic heart disease and other diseases of the circulatory system: Secondary | ICD-10-CM | POA: Diagnosis not present

## 2019-04-24 DIAGNOSIS — Z885 Allergy status to narcotic agent status: Secondary | ICD-10-CM | POA: Diagnosis not present

## 2019-04-24 DIAGNOSIS — I25810 Atherosclerosis of coronary artery bypass graft(s) without angina pectoris: Secondary | ICD-10-CM | POA: Diagnosis present

## 2019-04-24 DIAGNOSIS — Z992 Dependence on renal dialysis: Secondary | ICD-10-CM

## 2019-04-24 DIAGNOSIS — T82898A Other specified complication of vascular prosthetic devices, implants and grafts, initial encounter: Secondary | ICD-10-CM

## 2019-04-24 DIAGNOSIS — N186 End stage renal disease: Secondary | ICD-10-CM | POA: Diagnosis present

## 2019-04-24 DIAGNOSIS — Z7901 Long term (current) use of anticoagulants: Secondary | ICD-10-CM | POA: Diagnosis not present

## 2019-04-24 DIAGNOSIS — Y832 Surgical operation with anastomosis, bypass or graft as the cause of abnormal reaction of the patient, or of later complication, without mention of misadventure at the time of the procedure: Secondary | ICD-10-CM | POA: Diagnosis present

## 2019-04-24 DIAGNOSIS — M3214 Glomerular disease in systemic lupus erythematosus: Secondary | ICD-10-CM | POA: Diagnosis present

## 2019-04-24 DIAGNOSIS — L02414 Cutaneous abscess of left upper limb: Secondary | ICD-10-CM | POA: Diagnosis present

## 2019-04-24 DIAGNOSIS — N2581 Secondary hyperparathyroidism of renal origin: Secondary | ICD-10-CM | POA: Diagnosis present

## 2019-04-24 DIAGNOSIS — Z880 Allergy status to penicillin: Secondary | ICD-10-CM | POA: Diagnosis not present

## 2019-04-24 HISTORY — DX: Other specified complication of vascular prosthetic devices, implants and grafts, initial encounter: T82.898A

## 2019-04-24 LAB — BASIC METABOLIC PANEL
Anion gap: 16 — ABNORMAL HIGH (ref 5–15)
BUN: 53 mg/dL — ABNORMAL HIGH (ref 6–20)
CO2: 24 mmol/L (ref 22–32)
Calcium: 8.5 mg/dL — ABNORMAL LOW (ref 8.9–10.3)
Chloride: 96 mmol/L — ABNORMAL LOW (ref 98–111)
Creatinine, Ser: 11.61 mg/dL — ABNORMAL HIGH (ref 0.44–1.00)
GFR calc Af Amer: 4 mL/min — ABNORMAL LOW (ref >60)
GFR calc non Af Amer: 3 mL/min — ABNORMAL LOW (ref >60)
Glucose, Bld: 82 mg/dL (ref 70–99)
Potassium: 5 mmol/L (ref 3.5–5.1)
Sodium: 136 mmol/L (ref 135–145)

## 2019-04-24 LAB — CBC
HCT: 40 % (ref 36.0–46.0)
Hemoglobin: 12.5 g/dL (ref 12.0–15.0)
MCH: 30.2 pg (ref 26.0–34.0)
MCHC: 31.3 g/dL (ref 30.0–36.0)
MCV: 96.6 fL (ref 80.0–100.0)
Platelets: 102 10*3/uL — ABNORMAL LOW (ref 150–400)
RBC: 4.14 MIL/uL (ref 3.87–5.11)
RDW: 15.9 % — ABNORMAL HIGH (ref 11.5–15.5)
WBC: 4 10*3/uL (ref 4.0–10.5)
nRBC: 0 % (ref 0.0–0.2)

## 2019-04-24 LAB — SURGICAL PCR SCREEN
MRSA, PCR: NEGATIVE
Staphylococcus aureus: NEGATIVE

## 2019-04-24 LAB — GLUCOSE, CAPILLARY
Glucose-Capillary: 80 mg/dL (ref 70–99)
Glucose-Capillary: 109 mg/dL — ABNORMAL HIGH (ref 70–99)

## 2019-04-24 LAB — HIV ANTIBODY (ROUTINE TESTING W REFLEX): HIV Screen 4th Generation wRfx: NONREACTIVE

## 2019-04-24 MED ORDER — MIDODRINE HCL 5 MG PO TABS: 10.0000 mg | ORAL_TABLET | ORAL | Status: DC | PRN

## 2019-04-24 MED ORDER — CALCIUM ACETATE (PHOS BINDER) 667 MG PO CAPS: 1334.0000 mg | ORAL_CAPSULE | ORAL | Status: DC | PRN

## 2019-04-24 MED ORDER — ACETAMINOPHEN 325 MG PO TABS
650.0000 mg | ORAL_TABLET | Freq: Four times a day (QID) | ORAL | Status: DC | PRN
  Filled 2019-04-24: qty 2

## 2019-04-24 MED ORDER — LIDOCAINE-PRILOCAINE 2.5-2.5 % EX CREA: 1.0000 "application " | TOPICAL_CREAM | CUTANEOUS | Status: DC | PRN

## 2019-04-24 MED ORDER — AMIODARONE HCL 200 MG PO TABS
200.0000 mg | ORAL_TABLET | Freq: Two times a day (BID) | ORAL | Status: DC
  Administered 2019-04-24 – 2019-04-25 (×3): 200 mg via ORAL
  Filled 2019-04-24 (×5): qty 1

## 2019-04-24 MED ORDER — PENTAFLUOROPROP-TETRAFLUOROETH EX AERO: 1.0000 "application " | INHALATION_SPRAY | CUTANEOUS | Status: DC | PRN

## 2019-04-24 MED ORDER — WARFARIN - PHARMACIST DOSING INPATIENT: Freq: Every day | Status: DC

## 2019-04-24 MED ORDER — WARFARIN SODIUM 7.5 MG PO TABS
7.5000 mg | ORAL_TABLET | Freq: Once | ORAL | Status: AC
  Administered 2019-04-24: 7.5 mg via ORAL
  Filled 2019-04-24 (×2): qty 1

## 2019-04-24 MED ORDER — HEPARIN SODIUM (PORCINE) 1000 UNIT/ML IJ SOLN
INTRAMUSCULAR | Status: AC
  Filled 2019-04-24: qty 1

## 2019-04-24 MED ORDER — CALCIUM ACETATE (PHOS BINDER) 667 MG PO TABS: 2001.0000 mg | ORAL_TABLET | Freq: Three times a day (TID) | ORAL | Status: DC

## 2019-04-24 MED ORDER — SODIUM CHLORIDE 0.9 % IV SOLN: 100.0000 mL | INTRAVENOUS | Status: DC | PRN

## 2019-04-24 MED ORDER — ONDANSETRON HCL 4 MG/2ML IJ SOLN: 4.0000 mg | Freq: Four times a day (QID) | INTRAMUSCULAR | Status: DC | PRN

## 2019-04-24 MED ORDER — MIDODRINE HCL 5 MG PO TABS
ORAL_TABLET | ORAL | Status: AC
  Administered 2019-04-24: 10 mg via ORAL
  Filled 2019-04-24: qty 2

## 2019-04-24 MED ORDER — CALCIUM ACETATE (PHOS BINDER) 667 MG PO CAPS
1334.0000 mg | ORAL_CAPSULE | Freq: Two times a day (BID) | ORAL | Status: DC | PRN
  Filled 2019-04-24: qty 2

## 2019-04-24 MED ORDER — VANCOMYCIN HCL IN DEXTROSE 1-5 GM/200ML-% IV SOLN
1000.0000 mg | Freq: Once | INTRAVENOUS | Status: AC
  Administered 2019-04-24: 1000 mg via INTRAVENOUS
  Filled 2019-04-24: qty 200

## 2019-04-24 MED ORDER — ALTEPLASE 2 MG IJ SOLR: 2.0000 mg | Freq: Once | INTRAMUSCULAR | Status: DC | PRN

## 2019-04-24 MED ORDER — CALCIUM ACETATE (PHOS BINDER) 667 MG PO CAPS
2001.0000 mg | ORAL_CAPSULE | Freq: Three times a day (TID) | ORAL | Status: DC
  Filled 2019-04-24: qty 3

## 2019-04-24 MED ORDER — CALCIUM ACETATE (PHOS BINDER) 667 MG PO TABS
2001.0000 mg | ORAL_TABLET | Freq: Three times a day (TID) | ORAL | Status: DC
  Administered 2019-04-24 – 2019-04-25 (×2): 2001 mg via ORAL
  Filled 2019-04-24 (×2): qty 3

## 2019-04-24 MED ORDER — CALCITRIOL 0.25 MCG PO CAPS
ORAL_CAPSULE | ORAL | Status: AC
  Administered 2019-04-24: 0.25 ug via ORAL
  Filled 2019-04-24: qty 1

## 2019-04-24 MED ORDER — NON FORMULARY: 2001.0000 mg | Freq: Three times a day (TID) | Status: DC

## 2019-04-24 MED ORDER — LIDOCAINE HCL (PF) 1 % IJ SOLN: 5.0000 mL | INTRAMUSCULAR | Status: DC | PRN

## 2019-04-24 MED ORDER — CHLORHEXIDINE GLUCONATE CLOTH 2 % EX PADS
6.0000 | MEDICATED_PAD | Freq: Every day | CUTANEOUS | Status: DC
  Administered 2019-04-25: 6 via TOPICAL

## 2019-04-24 MED ORDER — CALCIUM ACETATE (PHOS BINDER) 667 MG PO TABS: 1334.0000 mg | ORAL_TABLET | Freq: Two times a day (BID) | ORAL | Status: DC

## 2019-04-24 MED ORDER — HEPARIN SODIUM (PORCINE) 1000 UNIT/ML DIALYSIS: 1000.0000 [IU] | INTRAMUSCULAR | Status: DC | PRN

## 2019-04-24 MED ORDER — ONDANSETRON HCL 4 MG PO TABS: 4.0000 mg | ORAL_TABLET | Freq: Four times a day (QID) | ORAL | Status: DC | PRN

## 2019-04-24 MED ORDER — CALCIUM ACETATE (PHOS BINDER) 667 MG PO CAPS
2001.0000 mg | ORAL_CAPSULE | Freq: Three times a day (TID) | ORAL | Status: DC
  Filled 2019-04-24 (×2): qty 3

## 2019-04-24 MED ORDER — CALCITRIOL 0.25 MCG PO CAPS
0.2500 ug | ORAL_CAPSULE | Freq: Every day | ORAL | Status: DC
  Administered 2019-04-25: 0.25 ug via ORAL
  Filled 2019-04-24: qty 1

## 2019-04-24 MED ORDER — ACETAMINOPHEN 650 MG RE SUPP: 650.0000 mg | Freq: Four times a day (QID) | RECTAL | Status: DC | PRN

## 2019-04-24 MED ORDER — NON FORMULARY: 1334.0000 mg | Freq: Two times a day (BID) | Status: DC

## 2019-04-24 NOTE — H&P (Signed)
History and Physical    Morgan Pope [id number] DOB: [birth year] DOA: 04/23/2019  PCP: [facility]  Patient coming from: Home.  Chief Complaint: Pain around the left AV fistula area.  HPI: Morgan Pope is a 49 y.o. female with history of lupus, A. fib, ESRD on hemodialysis on Monday Wednesday Friday started to experience pain in the left AV fistula area since the next day over the last 2 days.  Pain increased in intensity with no fever or chills.  Had some erythema around the fistula area.  Was unable to get her dialysis done yesterday and was referred to the ER because of the difficulty to cannulate the AV fistula.  Patient otherwise denies any chest pain shortness of breath or nausea vomiting or abdominal pain or diarrhea.  ED Course: In the ER patient was afebrile.  Labs show potassium of 5.7 CBC unremarkable.  On-call nephrologist was consulted who requested patient to be placed on vancomycin and keep patient n.p.o. and hold off the Coumadin for now.  Ultrasound of the left upper extremity shows a patent fistula.  Review of Systems: As per HPI, rest all negative.   Past Medical History:  Diagnosis Date  . Anemia in chronic kidney disease 01/21/2019  . 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 Chronic atrial fibrillation ([place]) 07/14/2014  . Dependence on renal dialysis ([place]) 01/21/2019  . Diarrhea, unspecified 01/21/2019  . 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 Disorder of phosphorus metabolism, unspecified 01/21/2019  . Dyspnea, unspecified 01/21/2019  . 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 End stage renal disease ([place]) 01/21/2019  . Hyperkalemia 01/25/2019  . Hypertension 04/04/2010  . Hypertensive chronic kidney disease with stage 5 chronic kidney disease or end stage renal disease ([place]) 01/21/2019  . Iron deficiency anemia, unspecified 01/21/2019  . Nonischemic cardiomyopathy ([place]) 04/14/2015   EF 28%.   Followed by [employer].   EF improved to 50% with therapy.  . Osteopenia 12/30/2011   On bone density 9/13  . Pain, unspecified 01/21/2019  . Pruritus, unspecified 01/21/2019  . Renal osteodystrophy 01/21/2019  . Secondary hyperparathyroidism of renal origin ([place]) 01/21/2019  . SLE glomerulonephritis syndrome, WHO class VI ([place]) 04/13/2019  . Vitamin D deficiency, unspecified 01/21/2019    Past Surgical History:  Procedure Laterality Date  . ABDOMINAL HYSTERECTOMY    . CARDIAC SURGERY     Released fluid from heart  . THYROIDECTOMY       reports that she has been smoking cigarettes. She has never used smokeless tobacco. She reports previous alcohol use. She reports that she does not use drugs.  Allergies  Allergen Reactions  . Codeine Rash    Other reaction(s): Unknown Itchy rash   . Penicillins     Other reaction(s): Unknown  . Fentanyl Nausea And Vomiting    Family History  Problem Relation Age of Onset  . Congestive Heart Failure Mother   . Diabetes Mother   . Renal Disease Mother   . Dementia Mother   . Diabetes Sister   . Renal Disease Sister   . Heart attack Brother     Prior to Admission medications   Medication Sig Start Date End Date Taking? Authorizing Provider  amiodarone (PACERONE) 200 MG tablet Take 200 mg by mouth 2 (two) times daily. 07/22/18  Yes [provider]  calcitRIOL (ROCALTROL) 0.25 MCG capsule Take 0.25 mcg by mouth daily.    Yes [provider]  Calcium Acetate 667 MG TABS Take  2,001 mg by mouth 3 (three) times daily with meals. And take 1334mg  with snacks 12/22/18  Yes [provider]  Cholecalciferol 50 MCG (2000 UT) TABS Take 2,000 Units by mouth daily.  01/25/19  Yes [provider]  fluticasone (FLONASE) 50 MCG/ACT nasal spray Place 2 sprays into both nostrils 2 (two) times daily. 01/14/19  Yes [provider]  midodrine (PROAMATINE) 10 MG tablet Take 10 mg by mouth 3 (three) times a week. Monday,  Wednesday, Friday 04/02/19  Yes [provider]  omeprazole (PRILOSEC) 20 MG capsule Take 20 mg by mouth daily. 12/19/18  Yes [provider]  warfarin (COUMADIN) 5 MG tablet Take 5 mg by mouth daily. 03/23/19  Yes [provider]    Physical Exam: Constitutional: Moderately built and nourished. Vitals:   04/23/19 2145 04/23/19 2200 04/23/19 2335 04/23/19 2340  BP:  105/77  113/74  Pulse: 72 (!) 102  82  Resp: 15 15  18   Temp:    97.6 F (36.4 C)  TempSrc:    Oral  SpO2: 98% 98%  99%  Weight:   83.7 kg   Height:   5\' 9"  (1.753 m)    Eyes: Anicteric no pallor. ENMT: No discharge from the ears eyes nose or mouth. Neck: No mass or.  No neck rigidity. Respiratory: No rhonchi or crepitations. Cardiovascular: S1-S2 heard. Abdomen: Nontender bowel sounds present. Musculoskeletal: No edema.  No obvious swelling seen in the left upper extremity. Skin: No obvious rash seen on the left AV fistula area. Neurologic: Alert awake oriented to time place and person.  Moves all extremities. Psychiatric: Appears normal with normal affect.   Labs on Admission: I have personally reviewed following labs and imaging studies  CBC: Recent Labs  Lab 04/23/19 1930  WBC 4.7  NEUTROABS 2.6  HGB 13.3  HCT 44.3  MCV 100.5*  PLT 123456*   Basic Metabolic Panel: Recent Labs  Lab 04/23/19 1930  NA 135  K 5.7*  CL 93*  CO2 24  GLUCOSE 81  BUN 51*  CREATININE 11.42*  CALCIUM 8.7*   GFR: Estimated Creatinine Clearance: 6.9 mL/min (A) (by C-G formula based on SCr of 11.42 mg/dL (H)). Liver Function Tests: Recent Labs  Lab 04/23/19 1930  AST 12*  ALT 7  ALKPHOS 62  BILITOT 0.9  PROT 7.1  ALBUMIN 3.5   No results for input(s): LIPASE, AMYLASE in the last 168 hours. No results for input(s): AMMONIA in the last 168 hours. Coagulation Profile: Recent Labs  Lab 04/23/19 2051  INR 1.8*   Cardiac Enzymes: No results for input(s): CKTOTAL, CKMB, CKMBINDEX,  TROPONINI in the last 168 hours. BNP (last 3 results) No results for input(s): PROBNP in the last 8760 hours. HbA1C: No results for input(s): HGBA1C in the last 72 hours. CBG: No results for input(s): GLUCAP in the last 168 hours. Lipid Profile: No results for input(s): CHOL, HDL, LDLCALC, TRIG, CHOLHDL, LDLDIRECT in the last 72 hours. Thyroid Function Tests: No results for input(s): TSH, T4TOTAL, FREET4, T3FREE, THYROIDAB in the last 72 hours. Anemia Panel: No results for input(s): VITAMINB12, FOLATE, FERRITIN, TIBC, IRON, RETICCTPCT in the last 72 hours. Urine analysis: No results found for: COLORURINE, APPEARANCEUR, LABSPEC, PHURINE, GLUCOSEU, HGBUR, BILIRUBINUR, KETONESUR, PROTEINUR, UROBILINOGEN, NITRITE, LEUKOCYTESUR Sepsis Labs: @LABRCNTIP (procalcitonin:4,lacticidven:4) ) Recent Results (from the past 240 hour(s))  Respiratory Panel by RT PCR (Flu A&B, Covid) - Nasopharyngeal Swab     Status: None   Collection Time: 04/23/19  7:12 PM  Specimen: Nasopharyngeal Swab  Result Value Ref Range Status   SARS Coronavirus 2 by RT PCR NEGATIVE NEGATIVE Final    Comment: (NOTE) SARS-CoV-2 target nucleic acids are NOT DETECTED. The SARS-CoV-2 RNA is generally detectable in upper respiratoy specimens during the acute phase of infection. The lowest concentration of SARS-CoV-2 viral copies this assay can detect is 131 copies/mL. A negative result does not preclude SARS-Cov-2 infection and should not be used as the sole basis for treatment or other patient management decisions. A negative result may occur with  improper specimen collection/handling, submission of specimen other than nasopharyngeal swab, presence of viral mutation(s) within the areas targeted by this assay, and inadequate number of viral copies (<131 copies/mL). A negative result must be combined with clinical observations, patient history, and epidemiological information. The expected result is Negative. Fact Sheet for  Patients:  PinkCheek.be Fact Sheet for Healthcare Providers:  GravelBags.it This test is not yet ap proved or cleared by the [place] FDA and  has been authorized for detection and/or diagnosis of SARS-CoV-2 by FDA under an Emergency Use Authorization (EUA). This EUA will remain  in effect (meaning this test can be used) for the duration of the COVID-19 declaration under Section 564(b)(1) of the Act, 21 U.S.C. section 360bbb-3(b)(1), unless the authorization is terminated or revoked sooner.    Influenza A by PCR NEGATIVE NEGATIVE Final   Influenza B by PCR NEGATIVE NEGATIVE Final    Comment: (NOTE) The Xpert Xpress SARS-CoV-2/FLU/RSV assay is intended as an aid in  the diagnosis of influenza from Nasopharyngeal swab specimens and  should not be used as a sole basis for treatment. Nasal washings and  aspirates are unacceptable for Xpert Xpress SARS-CoV-2/FLU/RSV  testing. Fact Sheet for Patients: PinkCheek.be Fact Sheet for Healthcare Providers: GravelBags.it This test is not yet approved or cleared by the [place] FDA and  has been authorized for detection and/or diagnosis of SARS-CoV-2 by  FDA under an Emergency Use Authorization (EUA). This EUA will remain  in effect (meaning this test can be used) for the duration of the  Covid-19 declaration under Section 564(b)(1) of the Act, 21  U.S.C. section 360bbb-3(b)(1), unless the authorization is  terminated or revoked. Performed at [facility], [place] [address], [place], [place] [address]      Radiological Exams on Admission: DG Chest Portable 1 View  Result Date: 04/23/2019 CLINICAL DATA:  Missed dialysis EXAM: PORTABLE CHEST 1 VIEW COMPARISON:  None FINDINGS: Hazy reticular opacities throughout the lungs with fissural and septal thickening as well as cephalized, indistinct pulmonary vascularity. No  consolidative opacity. No pneumothorax or effusion. Mild cardiomegaly with a calcified aorta. Surgical clips noted at the base of the right neck and upper mediastinum. Telemetry leads overlie the chest. No acute osseous or soft tissue abnormality. IMPRESSION: Features compatible with volume overload with mild-to-moderate interstitial edema and cardiomegaly. Aortic Atherosclerosis (ICD10-I70.0). Electronically Signed   By: Lovena Le M.D.   On: 04/23/2019 21:58   [place] LT UPPER EXTREM LTD SOFT TISSUE NON VASCULAR  Result Date: 04/23/2019 CLINICAL DATA:  Left upper extremity abscess. EXAM: ULTRASOUND LEFT UPPER EXTREMITY LIMITED TECHNIQUE: Ultrasound examination of the upper extremity soft tissues was performed in the area of clinical concern. COMPARISON:  None. FINDINGS: In the patient's palpable area of concern, there is a patent dialysis fistula. There is no evidence for abscess. No sonographic abnormality detected. IMPRESSION: Grossly patent left upper extremity AV fistula without evidence for an adjacent abscess or acute sonographic abnormality.  Electronically Signed   By: Constance Holster M.D.   On: 04/23/2019 22:43    EKG: Independently reviewed.  Sinus tachycardia.  Assessment/Plan Principal Problem:   AV fistula occlusion, initial encounter [facility]) Active Problems:   Chronic atrial fibrillation (HCC)   ESRD (end stage renal disease) (HCC)   Hypertension   Nonischemic cardiomyopathy (HCC)   SLE glomerulonephritis syndrome, WHO class VI (HCC)   Left arm pain    1. Left AV fistula pain and swelling with sonogram showing patent fistula.  Empiric antibiotics were started after blood cultures obtained.  Nephrology to see patient for further recommendation. 2. ESRD on hemodialysis Monday Wednesday Friday was unable to have dialysis done yesterday due to difficulty with accessing the fistula.  Patient was given Lokelma 5 g as recommended by the nephrologist.  Nephrologist to see patient for  further recommendation presently not in fluid overload.  Follow metabolic panel. 3. A. fib on amiodarone.  INR is around 1.8.  Warfarin on hold as recommended by nephrology.  If patient is going to be having Coumadin held for prolonged time will need to start heparin. 4. History of lupus presently not on medication.   DVT prophylaxis: Patient is on warfarin presently on hold INR is 1.8.  May need to bridge with heparin if Coumadin is going to be held for prolonged time. Code Status: Full code. Family Communication: Discussed with patient. Disposition Plan: Home. Consults called: Nephrology. Admission status: Observation.   Rise Patience MD [facility] Pager [phone].  If 7PM-7AM, please contact night-coverage [email or web address] Password [email or web address]  04/24/2019, 12:20 AM

## 2019-04-24 NOTE — Progress Notes (Signed)
[facility]                                                                              Patient Demographics  Morgan Pope, is a 49 y.o. female, DOB - May 07, 1970, [id number]  Admit date - 04/23/2019   Admitting Physician Rise Patience, MD  Outpatient Primary MD for the patient is [facility]  Outpatient specialists:   LOS - 0  days    Chief Complaint  Patient presents with  . Arm Problem       Brief summary  Morgan Pope is a 49 y.o. female with history of lupus, A. fib, ESRD on hemodialysis on Monday Wednesday Friday presents with 2-day history of pain in the left AV fistula with some associated redness, for which reason she was admitted for possible infected AV fistula  Assessment & Plan    Principal Problem:   AV fistula occlusion, initial encounter [facility]) Active Problems:   Chronic atrial fibrillation (HCC)   ESRD (end stage renal disease) ([place])   Hypertension   Nonischemic cardiomyopathy ([place])   SLE glomerulonephritis syndrome, WHO class VI ([place])   Left arm pain  Possible infected AV fistula -Antibiotic initiated on admission -Follow-up on blood cultures -Vascular surgery consulted-aggressive course of 7 to 10 days of antibiotic with plans to try to cannulate the fistula and schedule patient for placement of tunneled dialysis catheter 04/25/2019 -Coumadin may need to be switched to heparin for possible surgical intervention -switch back to Coumadin as needed post operatively  End-stage renal disease on hemodialysis Interstitial edema on x-ray of the chest on admission-ultrafiltration during dialysis Nephrology consulted for routine hemodialysis  Atrial fibrillation On Coumadin-held, heparin anticipation for surgery  Code Status: Full code DVT Prophylaxis: On Coumadin, heparin infusion for surgery Family Communication: Discussed in detail with the patient, all imaging results, lab results explained to the  patient   Disposition Plan: Home  Time Spent in minutes 35 minutes  Procedures:    Consultants:   Nephrology Vascular surgery  Antimicrobials:      Medications  Scheduled Meds: . amiodarone  200 mg Oral BID  . calcitRIOL  0.25 mcg Oral Daily  . calcium acetate  2,001 mg Oral TID WC  . Chlorhexidine Gluconate Cloth  6 each Topical Q0600   Continuous Infusions: PRN Meds:.acetaminophen **OR** acetaminophen, calcium acetate, hydrALAZINE, ondansetron **OR** ondansetron (ZOFRAN) IV   Antibiotics   Anti-infectives (From admission, onward)   Start     Dose/Rate Route Frequency Ordered Stop   04/23/19 2215  vancomycin (VANCOCIN) 2,000 mg in sodium chloride 0.9 % 500 mL IVPB     2,000 mg 250 mL/hr over 120 Minutes Intravenous  Once 04/23/19 2203 04/24/19 [id number]        Subjective:   Morgan Pope was seen and examined today.    Patient denies dizziness, chest pain, shortness of breath, abdominal pain, N/V/D/C, new weakness, numbess, tingling. No acute events overnight.    Objective:   Vitals:   04/23/19 2200 04/23/19 2335 04/23/19 2340 04/24/19 0533  BP: 105/77  113/74 112/70  Pulse: (!) 102  82 80  Resp: 15  18 18  Temp:   97.6 F (36.4 C) 97.7 F (36.5 C)  TempSrc:   Oral Oral  SpO2: 98%  99% 99%  Weight:  83.7 kg    Height:  5\' 9"  (1.753 m)      Intake/Output Summary (Last 24 hours) at 04/24/2019 1015 Last data filed at 04/24/2019 0533 Gross per 24 hour  Intake 290.51 ml  Output 0 ml  Net 290.51 ml     Wt Readings from Last 3 Encounters:  04/23/19 83.7 kg  04/13/19 82.1 kg     Exam  General: NAD  HEENT: NCAT,  PERRL,MMM  Neck: SUPPLE, (-) JVD  Cardiovascular: RRR, (-) GALLOP, (-) MURMUR  Respiratory: CTA  Gastrointestinal: SOFT, (-) DISTENSION, BS(+), (_) TENDERNESS  Ext: (-) CYANOSIS, (-) EDEMA, left AV fistula noted with palpable thrill, mild erythema noted  Neuro: A, OX 3  Skin:(-) RASH  Psych:NORMAL AFFECT/MOOD   Data  Reviewed:  I have personally reviewed following labs and imaging studies  Micro Results Recent Results (from the past 240 hour(s))  Culture, blood (routine x 2)     Status: None (Preliminary result)   Collection Time: 04/23/19  6:44 PM   Specimen: BLOOD RIGHT HAND  Result Value Ref Range Status   Specimen Description BLOOD RIGHT HAND  Final   Special Requests   Final    BOTTLES DRAWN AEROBIC AND ANAEROBIC Blood Culture results may not be optimal due to an inadequate volume of blood received in culture bottles   Culture   Final    NO GROWTH < 12 HOURS Performed at [facility], [place] [address] [place], [place] [address]    Report Status PENDING  Incomplete  Respiratory Panel by RT PCR (Flu A&B, Covid) - Nasopharyngeal Swab     Status: None   Collection Time: 04/23/19  7:12 PM   Specimen: Nasopharyngeal Swab  Result Value Ref Range Status   SARS Coronavirus 2 by RT PCR NEGATIVE NEGATIVE Final    Comment: (NOTE) SARS-CoV-2 target nucleic acids are NOT DETECTED. The SARS-CoV-2 RNA is generally detectable in upper respiratoy specimens during the acute phase of infection. The lowest concentration of SARS-CoV-2 viral copies this assay can detect is 131 copies/mL. A negative result does not preclude SARS-Cov-2 infection and should not be used as the sole basis for treatment or other patient management decisions. A negative result may occur with  improper specimen collection/handling, submission of specimen other than nasopharyngeal swab, presence of viral mutation(s) within the areas targeted by this assay, and inadequate number of viral copies (<131 copies/mL). A negative result must be combined with clinical observations, patient history, and epidemiological information. The expected result is Negative. Fact Sheet for Patients:  PinkCheek.be Fact Sheet for Healthcare Providers:  GravelBags.it This test is not yet ap  proved or cleared by the [place] FDA and  has been authorized for detection and/or diagnosis of SARS-CoV-2 by FDA under an Emergency Use Authorization (EUA). This EUA will remain  in effect (meaning this test can be used) for the duration of the COVID-19 declaration under Section 564(b)(1) of the Act, 21 U.S.C. section 360bbb-3(b)(1), unless the authorization is terminated or revoked sooner.    Influenza A by PCR NEGATIVE NEGATIVE Final   Influenza B by PCR NEGATIVE NEGATIVE Final    Comment: (NOTE) The Xpert Xpress SARS-CoV-2/FLU/RSV assay is intended as an aid in  the diagnosis of influenza from Nasopharyngeal swab specimens and  should not be used as a sole basis for treatment. Nasal  washings and  aspirates are unacceptable for Xpert Xpress SARS-CoV-2/FLU/RSV  testing. Fact Sheet for Patients: PinkCheek.be Fact Sheet for Healthcare Providers: GravelBags.it This test is not yet approved or cleared by the [place] FDA and  has been authorized for detection and/or diagnosis of SARS-CoV-2 by  FDA under an Emergency Use Authorization (EUA). This EUA will remain  in effect (meaning this test can be used) for the duration of the  Covid-19 declaration under Section 564(b)(1) of the Act, 21  U.S.C. section 360bbb-3(b)(1), unless the authorization is  terminated or revoked. Performed at [facility], [place] [address] [place], [place] [address]   Culture, blood (routine x 2)     Status: None (Preliminary result)   Collection Time: 04/23/19  8:51 PM   Specimen: Site Not Specified; Blood  Result Value Ref Range Status   Specimen Description SITE NOT SPECIFIED  Final   Special Requests   Final    BOTTLES DRAWN AEROBIC ONLY Blood Culture adequate volume   Culture   Final    NO GROWTH < 12 HOURS Performed at [facility], [place] [address], [place], [place] [address]    Report Status PENDING  Incomplete  Surgical  pcr screen     Status: None   Collection Time: 04/24/19 12:44 AM   Specimen: Nasal Mucosa; Nasal Swab  Result Value Ref Range Status   MRSA, PCR NEGATIVE NEGATIVE Final   Staphylococcus aureus NEGATIVE NEGATIVE Final    Comment: (NOTE) The Xpert SA Assay (FDA approved for NASAL specimens in patients 62 years of age and older), is one component of a comprehensive surveillance program. It is not intended to diagnose infection nor to guide or monitor treatment. Performed at [facility], [place] [address] [place], [place] [address]     Radiology Reports DG Chest Portable 1 View  Result Date: 04/23/2019 CLINICAL DATA:  Missed dialysis EXAM: PORTABLE CHEST 1 VIEW COMPARISON:  None FINDINGS: Hazy reticular opacities throughout the lungs with fissural and septal thickening as well as cephalized, indistinct pulmonary vascularity. No consolidative opacity. No pneumothorax or effusion. Mild cardiomegaly with a calcified aorta. Surgical clips noted at the base of the right neck and upper mediastinum. Telemetry leads overlie the chest. No acute osseous or soft tissue abnormality. IMPRESSION: Features compatible with volume overload with mild-to-moderate interstitial edema and cardiomegaly. Aortic Atherosclerosis (ICD10-I70.0). Electronically Signed   By: Lovena Le M.D.   On: 04/23/2019 21:58   [place] LT UPPER EXTREM LTD SOFT TISSUE NON VASCULAR  Result Date: 04/23/2019 CLINICAL DATA:  Left upper extremity abscess. EXAM: ULTRASOUND LEFT UPPER EXTREMITY LIMITED TECHNIQUE: Ultrasound examination of the upper extremity soft tissues was performed in the area of clinical concern. COMPARISON:  None. FINDINGS: In the patient's palpable area of concern, there is a patent dialysis fistula. There is no evidence for abscess. No sonographic abnormality detected. IMPRESSION: Grossly patent left upper extremity AV fistula without evidence for an adjacent abscess or acute sonographic abnormality. Electronically Signed    By: Constance Holster M.D.   On: 04/23/2019 22:43    Lab Data:  CBC: Recent Labs  Lab 04/23/19 1930 04/24/19 0440  WBC 4.7 4.0  NEUTROABS 2.6  --   HGB 13.3 12.5  HCT 44.3 40.0  MCV 100.5* 96.6  PLT 113* [id number]*   Basic Metabolic Panel: Recent Labs  Lab 04/23/19 1930 04/24/19 0440  NA 135 136  K 5.7* 5.0  CL 93* 96*  CO2 24 24  GLUCOSE 81  82  BUN 51* 53*  CREATININE 11.42* 11.61*  CALCIUM 8.7* 8.5*   GFR: Estimated Creatinine Clearance: 6.8 mL/min (A) (by C-G formula based on SCr of 11.61 mg/dL (H)). Liver Function Tests: Recent Labs  Lab 04/23/19 1930  AST 12*  ALT 7  ALKPHOS 62  BILITOT 0.9  PROT 7.1  ALBUMIN 3.5   No results for input(s): LIPASE, AMYLASE in the last 168 hours. No results for input(s): AMMONIA in the last 168 hours. Coagulation Profile: Recent Labs  Lab 04/23/19 2051  INR 1.8*   Cardiac Enzymes: No results for input(s): CKTOTAL, CKMB, CKMBINDEX, TROPONINI in the last 168 hours. BNP (last 3 results) No results for input(s): PROBNP in the last 8760 hours. HbA1C: No results for input(s): HGBA1C in the last 72 hours. CBG: Recent Labs  Lab 04/24/19 0543  GLUCAP 80   Lipid Profile: No results for input(s): CHOL, HDL, LDLCALC, TRIG, CHOLHDL, LDLDIRECT in the last 72 hours. Thyroid Function Tests: No results for input(s): TSH, T4TOTAL, FREET4, T3FREE, THYROIDAB in the last 72 hours. Anemia Panel: No results for input(s): VITAMINB12, FOLATE, FERRITIN, TIBC, IRON, RETICCTPCT in the last 72 hours. Urine analysis: No results found for: COLORURINE, APPEARANCEUR, LABSPEC, PHURINE, GLUCOSEU, HGBUR, BILIRUBINUR, KETONESUR, PROTEINUR, UROBILINOGEN, NITRITE, Milus Height M.D. [facility] 04/24/2019, 10:15 AM  Pager: [phone] Between 7am to 7pm - call Pager - [phone]  After 7pm go to [email or web address] - password [email or web address]  Call night coverage person covering after 7pm

## 2019-04-24 NOTE — Progress Notes (Signed)
ANTICOAGULATION CONSULT NOTE - Initial Consult  Pharmacy Consult for warfarin Indication: atrial fibrillation  Allergies  Allergen Reactions  . Codeine Rash    Other reaction(s): Unknown Itchy rash   . Penicillins     Other reaction(s): Unknown  . Fentanyl Nausea And Vomiting    Patient Measurements: Height: 5\' 9"  (175.3 cm) Weight: 184 lb 9.6 oz (83.7 kg) IBW/kg (Calculated) : 66.2  Vital Signs: Temp: 98 F (36.7 C) (02/06 0900) Temp Source: Oral (02/06 0900) BP: 116/71 (02/06 0900) Pulse Rate: 78 (02/06 0900)  Labs: Recent Labs    04/23/19 1930 04/23/19 2051 04/24/19 0440  HGB 13.3  --  12.5  HCT 44.3  --  40.0  PLT 113*  --  102*  LABPROT  --  20.4*  --   INR  --  1.8*  --   CREATININE 11.42*  --  11.61*    Estimated Creatinine Clearance: 6.8 mL/min (A) (by C-G formula based on SCr of 11.61 mg/dL (H)).   Medical History: Past Medical History:  Diagnosis Date  . Anemia in chronic kidney disease 01/21/2019  . 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 Chronic atrial fibrillation ([place]) 07/14/2014  . Dependence on renal dialysis ([place]) 01/21/2019  . Diarrhea, unspecified 01/21/2019  . 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 Disorder of phosphorus metabolism, unspecified 01/21/2019  . Dyspnea, unspecified 01/21/2019  . 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 End stage renal disease ([place]) 01/21/2019  . Hyperkalemia 01/25/2019  . Hypertension 04/04/2010  . Hypertensive chronic kidney disease with stage 5 chronic kidney disease or end stage renal disease ([place]) 01/21/2019  . Iron deficiency anemia, unspecified 01/21/2019  . Nonischemic cardiomyopathy ([place]) 04/14/2015   EF 28%.  Followed by [employer].   EF improved to 50% with therapy.  . Osteopenia 12/30/2011   On bone density 9/13  . Pain, unspecified 01/21/2019  . Pruritus, unspecified 01/21/2019  . Renal osteodystrophy 01/21/2019  . Secondary hyperparathyroidism of  renal origin ([place]) 01/21/2019  . SLE glomerulonephritis syndrome, WHO class VI ([place]) 04/13/2019  . Vitamin D deficiency, unspecified 01/21/2019    Medications:  Scheduled:  . amiodarone  200 mg Oral BID  . calcitRIOL  0.25 mcg Oral Daily  . calcium acetate  2,001 mg Oral TID WC  . Chlorhexidine Gluconate Cloth  6 each Topical Q0600  . heparin        Assessment: 11 yoF admitted on 2/5 for AV fistula pain and swelling. On warfarin PTA for AFib, INR on admit was 1.8, last dose taken in 2/4. Warfarin was put on hold since admission in case [facility] placement is needed, however pt is able to get HD via AV fistula today, so no need for procedure tomorrow. Pharmacy consulted to resume warfarin.  Will give higher dose today since missed dose yesterday. Can likely resume home regimen tomorrow. CBC stable.   PTA regimen: 5 mg PO daily  Goal of Therapy:  INR 2-3 Monitor platelets by anticoagulation protocol: Yes   Plan:  Warfarin 7.5 mg PO tonight Daily INR and s/sx   Thank you for the consult.  Berenice Bouton, PharmD PGY1 Pharmacy Resident  Please check AMION for all [place] phone numbers After 10:00 PM, call [place] [phone]  04/24/2019,3:03 PM

## 2019-04-24 NOTE — Consult Note (Signed)
Patient is a 49 year old female who we are asked to evaluate for pain over her left arm AV fistula.  Patient has only had this fistula as her previous access.  She developed some tenderness and redness around the distal aspect of the fistula yesterday.  She has had no fever or chills.  She states it is slightly improved today.  There is no prosthetic in the arm.  Physical exam:  Vitals:   04/23/19 2200 04/23/19 2335 04/23/19 2340 04/24/19 0533  BP: 105/77  113/74 112/70  Pulse: (!) 102  82 80  Resp: 15  18 18   Temp:   97.6 F (36.4 C) 97.7 F (36.5 C)  TempSrc:   Oral Oral  SpO2: 98%  99% 99%  Weight:  83.7 kg    Height:  5\' 9"  (1.753 m)      Left upper extremity: Palpable thrill in fistula.  Mild aneurysmal degeneration midportion with some thin skin but no ulceration area of erythema over the distal aneurysm extending over the circumference of about 4 to 5 mm.  Slightly tender to palpation.  Assessment: Mild erythema over AV fistula.  Potentially either small hematoma or area of cellulitis.  Plan: I believe it is okay to try to cannulate the fistula today.  If she has too much pain or it is difficult to cannulate the fistula, we will schedule her for placement of a tunneled dialysis catheter tomorrow morning.  She can eat today from my standpoint.  She probably would benefit from a course of 7 to 10 days of oral antibiotics but overall should be a fairly low risk of infection since this is vein and not prosthetic.  Please call me later today if they are unable to cannulate the fistula and we will get her scheduled for catheter tomorrow.  Ruta Hinds, MD [facility] Office: [phone]

## 2019-04-24 NOTE — Consult Note (Signed)
[facility] Renal Consultation Note    Indication for Consultation:  Management of ESRD/hemodialysis; anemia, hypertension/volume and secondary hyperparathyroidism  [id number], Merce Family  HPI: Morgan Pope is a 49 y.o. female. 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 Past medical history significant for Lupus, AFIB on coumadin,  NICM, HFpEF, HLD, GERD, AOCD, SHPT.   Patient presented to the ED by direction of her dialysis center due to concern for infected LU AVF.  Reports pain in fistula starting 3 days ago, followed by swelling 2 days ago and erythema noted yesterday morning.  She refused to let the dialysis stick her access due to pain and concern for infection.  Reports access was placed about 8 years ago by Dr. Lynnell Chad in [place].  She has had no complications with it until now.  Denies fever, chills, n/v/d, SOB, edema, abdominal pain, weakness, dizziness and fatigue.  Reports pain is improving. Last dialysis completed on 04/21/19 and left close to her EDW.    Pertinent findings since admission include K 5.0, CXR showing interstitial edema, [place] fistula showed patent AVF with no adjacent abscess. Vancomycin started and patient placed NPO after midnight and coumadin held, last INR 1.8.  Patient admitted for further evaluation and management.  Past Medical History:  Diagnosis Date  . Anemia in chronic kidney disease 01/21/2019  . 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 Chronic atrial fibrillation ([place]) 07/14/2014  . Dependence on renal dialysis ([place]) 01/21/2019  . Diarrhea, unspecified 01/21/2019  . 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 Disorder of phosphorus metabolism, unspecified 01/21/2019  . Dyspnea, unspecified 01/21/2019  . 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 End stage renal disease ([place]) 01/21/2019  . Hyperkalemia  01/25/2019  . Hypertension 04/04/2010  . Hypertensive chronic kidney disease with stage 5 chronic kidney disease or end stage renal disease ([place]) 01/21/2019  . Iron deficiency anemia, unspecified 01/21/2019  . Nonischemic cardiomyopathy ([place]) 04/14/2015   EF 28%.  Followed by [employer].   EF improved to 50% with therapy.  . Osteopenia 12/30/2011   On bone density 9/13  . Pain, unspecified 01/21/2019  . Pruritus, unspecified 01/21/2019  . Renal osteodystrophy 01/21/2019  . Secondary hyperparathyroidism of renal origin ([place]) 01/21/2019  . SLE glomerulonephritis syndrome, WHO class VI ([place]) 04/13/2019  . Vitamin D deficiency, unspecified 01/21/2019   Past Surgical History:  Procedure Laterality Date  . ABDOMINAL HYSTERECTOMY    . CARDIAC SURGERY     Released fluid from heart  . THYROIDECTOMY     Family History  Problem Relation Age of Onset  . Congestive Heart Failure Mother   . Diabetes Mother   . Renal Disease Mother   . Dementia Mother   . Diabetes Sister   . Renal Disease Sister   . Heart attack Brother    Social History:  reports that she has been smoking cigarettes. She has never used smokeless tobacco. She reports previous alcohol use. She reports that she does not use drugs. Allergies  Allergen Reactions  . Codeine Rash    Other reaction(s): Unknown Itchy rash   . Penicillins     Other reaction(s): Unknown  . Fentanyl Nausea And Vomiting   Prior to Admission medications   Medication Sig Start Date End Date Taking? Authorizing Provider  amiodarone (PACERONE) 200 MG tablet Take 200 mg by mouth 2 (two) times daily. 07/22/18  Yes [provider]  calcitRIOL (ROCALTROL) 0.25 MCG capsule Take 0.25 mcg by mouth daily.    Yes [provider]  Calcium Acetate 667 MG TABS Take 2,001 mg by mouth 3 (three) times daily with meals. And take 1334mg  with snacks 12/22/18  Yes [provider]  Cholecalciferol 50 MCG (2000 UT) TABS Take 2,000 Units by mouth daily.   01/25/19  Yes [provider]  fluticasone (FLONASE) 50 MCG/ACT nasal spray Place 2 sprays into both nostrils 2 (two) times daily. 01/14/19  Yes [provider]  midodrine (PROAMATINE) 10 MG tablet Take 10 mg by mouth 3 (three) times a week. Monday, Wednesday, Friday 04/02/19  Yes [provider]  omeprazole (PRILOSEC) 20 MG capsule Take 20 mg by mouth daily. 12/19/18  Yes [provider]  warfarin (COUMADIN) 5 MG tablet Take 5 mg by mouth daily. 03/23/19  Yes [provider]   Current Facility-Administered Medications  Medication Dose Route Frequency Provider Last Rate Last Admin  . acetaminophen (TYLENOL) tablet 650 mg  650 mg Oral Q6H PRN Rise Patience, MD       Or  . acetaminophen (TYLENOL) suppository 650 mg  650 mg Rectal Q6H PRN Rise Patience, MD      . amiodarone (PACERONE) tablet 200 mg  200 mg Oral BID Rise Patience, MD      . calcitRIOL (ROCALTROL) capsule 0.25 mcg  0.25 mcg Oral Daily Rise Patience, MD      . calcium acetate (PHOSLO) capsule 1,334 mg  1,334 mg Oral PRN Rise Patience, MD      . calcium acetate (PHOSLO) capsule 2,001 mg  2,001 mg Oral TID WC Rise Patience, MD      . hydrALAZINE (APRESOLINE) injection 10 mg  10 mg Intravenous Q4H PRN Rise Patience, MD      . ondansetron [facility]) tablet 4 mg  4 mg Oral Q6H PRN Rise Patience, MD       Or  . ondansetron [facility]) injection 4 mg  4 mg Intravenous Q6H PRN Rise Patience, MD       Labs: Basic Metabolic Panel: Recent Labs  Lab 04/23/19 1930 04/24/19 0440  NA 135 136  K 5.7* 5.0  CL 93* 96*  CO2 24 24  GLUCOSE 81 82  BUN 51* 53*  CREATININE 11.42* 11.61*  CALCIUM 8.7* 8.5*   Liver Function Tests: Recent Labs  Lab 04/23/19 1930  AST 12*  ALT 7  ALKPHOS 62  BILITOT 0.9  PROT 7.1  ALBUMIN 3.5  CBC: Recent Labs  Lab 04/23/19 1930 04/24/19 0440  WBC 4.7 4.0  NEUTROABS 2.6  --   HGB 13.3 12.5  HCT 44.3  40.0  MCV 100.5* 96.6  PLT 113* 102*   CBG: Recent Labs  Lab 04/24/19 0543  GLUCAP 80   Studies/Results: DG Chest Portable 1 View  Result Date: 04/23/2019 CLINICAL DATA:  Missed dialysis EXAM: PORTABLE CHEST 1 VIEW COMPARISON:  None FINDINGS: Hazy reticular opacities throughout the lungs with fissural and septal thickening as well as cephalized, indistinct pulmonary vascularity. No consolidative opacity. No pneumothorax or effusion. Mild cardiomegaly with a calcified aorta. Surgical clips noted at the base of the right neck and upper mediastinum. Telemetry leads overlie the chest. No acute osseous or soft tissue abnormality. IMPRESSION: Features compatible with volume overload with mild-to-moderate interstitial edema and cardiomegaly. Aortic Atherosclerosis (ICD10-I70.0). Electronically Signed   By: Lovena Le M.D.   On: 04/23/2019 21:58   [place] LT UPPER  EXTREM LTD SOFT TISSUE NON VASCULAR  Result Date: 04/23/2019 CLINICAL DATA:  Left upper extremity abscess. EXAM: ULTRASOUND LEFT UPPER EXTREMITY LIMITED TECHNIQUE: Ultrasound examination of the upper extremity soft tissues was performed in the area of clinical concern. COMPARISON:  None. FINDINGS: In the patient's palpable area of concern, there is a patent dialysis fistula. There is no evidence for abscess. No sonographic abnormality detected. IMPRESSION: Grossly patent left upper extremity AV fistula without evidence for an adjacent abscess or acute sonographic abnormality. Electronically Signed   By: Constance Holster M.D.   On: 04/23/2019 22:43    ROS: All others negative except those listed in HPI.  Physical Exam: Vitals:   04/23/19 2200 04/23/19 2335 04/23/19 2340 04/24/19 0533  BP: 105/77  113/74 112/70  Pulse: (!) 102  82 80  Resp: 15  18 18   Temp:   97.6 F (36.4 C) 97.7 F (36.5 C)  TempSrc:   Oral Oral  SpO2: 98%  99% 99%  Weight:  83.7 kg    Height:  5\' 9"  (1.753 m)       General: WDWN NAD, well appearing female Head:  NCAT sclera not icteric Neck: Supple. No lymphadenopathy Lungs: CTA bilaterally. No wheeze, rales or rhonchi. Breathing is unlabored. Heart: RRR. No murmur, rubs or gallops.  Abdomen: soft, nontender, +BS, no guarding, no rebound tenderness Lower extremities:no edema, ischemic changes, or open wounds  Neuro: AAOx3. Moves all extremities spontaneously. Psych:  Responds to questions appropriately with a normal affect. Dialysis Access: LU AVF +thrill, +edema, mild erythema on the upper portion  Dialysis Orders:  MWF  - [place]  3.25hrs, BFR 400, DFR 800,  EDW81kg, 2K/ 2.25Ca  Access: LU AVF  Heparin 1000 Calcitriol 0.7mcg PO qHD  Assessment/Plan: 1.  LU AVF pain & swelling - US shows patent AVF w/o evidence abscess.  BC NGTD, Vanc started. Patient NPO & coumadin held in case procedure needed. VVS consulted to evaluate fistula and advise if usable or place [facility] if needed. Appreciate their assistance. 2.  ESRD -  On HD MWF.  Plan for today either with [facility] or AVF pending VVS eval.  Will write orders. K 5.0. 3.  Hypertension/volume  - Blood pressure well controlled. CXR shows interstitial edema.  Will obtain standing weight.  Plan for UF goal to dry or 3L as tolerated.   4.  Anemia of CKD - 12.5, no indication for ESA at this time.  5.  Secondary Hyperparathyroidism -  Ca at goal. Will check phos. Continue VDRA, binders.  6.  Nutrition - currently NPO.  Renal diet w/fluid restrictions once advanced.  7. Lupus 8. A fib - coumadin held, INR 1.8  Jen Mow, PA-C [facility] Pager: [phone] 04/24/2019, 8:35 AM

## 2019-04-25 LAB — CBC
HCT: 39.1 % (ref 36.0–46.0)
Hemoglobin: 12.4 g/dL (ref 12.0–15.0)
MCH: 30.4 pg (ref 26.0–34.0)
MCHC: 31.7 g/dL (ref 30.0–36.0)
MCV: 95.8 fL (ref 80.0–100.0)
Platelets: 81 10*3/uL — ABNORMAL LOW (ref 150–400)
RBC: 4.08 MIL/uL (ref 3.87–5.11)
RDW: 15.9 % — ABNORMAL HIGH (ref 11.5–15.5)
WBC: 3.4 10*3/uL — ABNORMAL LOW (ref 4.0–10.5)
nRBC: 0 % (ref 0.0–0.2)

## 2019-04-25 LAB — GLUCOSE, CAPILLARY
Glucose-Capillary: 72 mg/dL (ref 70–99)
Glucose-Capillary: 72 mg/dL (ref 70–99)

## 2019-04-25 LAB — APTT: aPTT: 45 seconds — ABNORMAL HIGH (ref 24–36)

## 2019-04-25 LAB — PROTIME-INR
INR: 1.7 — ABNORMAL HIGH (ref 0.8–1.2)
Prothrombin Time: 19.4 seconds — ABNORMAL HIGH (ref 11.4–15.2)

## 2019-04-25 MED ORDER — DOXYCYCLINE HYCLATE 100 MG PO TABS: 100.0000 mg | ORAL_TABLET | Freq: Two times a day (BID) | ORAL | 0 refills | Status: DC

## 2019-04-25 MED ORDER — WARFARIN SODIUM 7.5 MG PO TABS: 7.5000 mg | ORAL_TABLET | Freq: Once | ORAL | Status: DC

## 2019-04-25 MED ORDER — DOXYCYCLINE HYCLATE 100 MG PO TABS
100.0000 mg | ORAL_TABLET | Freq: Two times a day (BID) | ORAL | Status: DC
  Administered 2019-04-25: 100 mg via ORAL
  Filled 2019-04-25: qty 1

## 2019-04-25 NOTE — Progress Notes (Signed)
DISCHARGE NOTE HOME Morgan Pope to be discharged Home per MD order. Discussed prescriptions and follow up appointments with the patient. Prescriptions given to patient; medication list explained in detail. Patient verbalized understanding.  Skin clean, dry and intact without evidence of skin break down, no evidence of skin tears noted. IV catheter discontinued intact. Site without signs and symptoms of complications. Dressing and pressure applied. Pt denies pain at the site currently. No complaints noted.  Patient free of lines, drains, and wounds.   An After Visit Summary (AVS) was printed and given to the patient. Patient escorted via wheelchair, and discharged home via private auto.  Paulla Fore, RN

## 2019-04-25 NOTE — Plan of Care (Signed)
  Problem: Education: Goal: Knowledge of General Education information will improve Description Including pain rating scale, medication(s)/side effects and non-pharmacologic comfort measures Outcome: Progressing   

## 2019-04-25 NOTE — Progress Notes (Signed)
[place] for warfarin Indication: atrial fibrillation  Allergies  Allergen Reactions  . Codeine Rash    Other reaction(s): Unknown Itchy rash   . Penicillins     Other reaction(s): Unknown  . Fentanyl Nausea And Vomiting  . Phoslo [Calcium Acetate (Phos Binder)]     GI upset, does tolerate calcium acetate tab - [place]    Patient Measurements: Height: 5\' 9"  (175.3 cm) Weight: 175 lb 12.8 oz (79.7 kg) IBW/kg (Calculated) : 66.2  Vital Signs: Temp: 98.1 F (36.7 C) (02/07 0452) Temp Source: Oral (02/07 0452) BP: 94/64 (02/07 0452) Pulse Rate: 75 (02/07 0452)  Labs: Recent Labs    04/23/19 1930 04/23/19 1930 04/23/19 2051 04/24/19 0440 04/25/19 0546 04/25/19 0630  HGB 13.3   < >  --  12.5 12.4  --   HCT 44.3  --   --  40.0 39.1  --   PLT 113*  --   --  102* 81*  --   APTT  --   --   --   --   --  45*  LABPROT  --   --  20.4*  --   --  19.4*  INR  --   --  1.8*  --   --  1.7*  CREATININE 11.42*  --   --  11.61*  --   --    < > = values in this interval not displayed.    Estimated Creatinine Clearance: 6.6 mL/min (A) (by C-G formula based on SCr of 11.61 mg/dL (H)).   Medical History: Past Medical History:  Diagnosis Date  . Anemia in chronic kidney disease 01/21/2019  . 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 Chronic atrial fibrillation ([place]) 07/14/2014  . Dependence on renal dialysis ([place]) 01/21/2019  . Diarrhea, unspecified 01/21/2019  . 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 Disorder of phosphorus metabolism, unspecified 01/21/2019  . Dyspnea, unspecified 01/21/2019  . 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 End stage renal disease ([place]) 01/21/2019  . Hyperkalemia 01/25/2019  . Hypertension 04/04/2010  . Hypertensive chronic kidney disease with stage 5 chronic kidney disease or end stage renal disease ([place]) 01/21/2019  . Iron deficiency anemia, unspecified 01/21/2019   . Nonischemic cardiomyopathy ([place]) 04/14/2015   EF 28%.  Followed by [employer].   EF improved to 50% with therapy.  . Osteopenia 12/30/2011   On bone density 9/13  . Pain, unspecified 01/21/2019  . Pruritus, unspecified 01/21/2019  . Renal osteodystrophy 01/21/2019  . Secondary hyperparathyroidism of renal origin ([place]) 01/21/2019  . SLE glomerulonephritis syndrome, WHO class VI ([place]) 04/13/2019  . Vitamin D deficiency, unspecified 01/21/2019    Medications:  Scheduled:  . amiodarone  200 mg Oral BID  . calcitRIOL  0.25 mcg Oral Daily  . calcium acetate  2,001 mg Oral TID WC  . calcium acetate  1,334 mg Oral BID BM  . Chlorhexidine Gluconate Cloth  6 each Topical Q0600  . Warfarin - Pharmacist Dosing Inpatient   Does not apply q1800    Assessment: 42 yoF admitted on 2/5 for AV fistula pain and swelling. On warfarin PTA for AFib, INR on admit was 1.8, last dose taken in 2/4. Warfarin was put on hold since admission in case [facility] placement is needed, however pt was able to get HD via AV fistula on 2/6, so no need for procedure today. Pharmacy consulted to resume warfarin.  INR subtherapeutic and slightly down 1.7 today  after 7.5 mg yesterday, reflecting missed dose on 2/5. Hgb stable, Plt low and dropped from 113 to 81. Good meal intake.   PTA regimen: 5 mg PO daily  Goal of Therapy:  INR 2-3 Monitor platelets by anticoagulation protocol: Yes   Plan:  Warfarin 7.5 mg PO tonight Daily INR and s/sx of bleeding  Thank you for the consult.  Berenice Bouton, PharmD PGY1 Pharmacy Resident  Please check AMION for all [place] phone numbers After 10:00 PM, call [place] [phone]  04/25/2019,9:53 AM

## 2019-04-25 NOTE — Progress Notes (Signed)
[facility] Progress Note   Subjective:   Patient seen and examined at bedside.  Reports arm is feeling better, less swelling and pain.  Denies SOB, n/v/d, abdominal pain, weakness, dizziness and fatigue.   Objective Vitals:   04/24/19 1730 04/24/19 1733 04/24/19 2127 04/25/19 0452  BP: 96/60 (!) 91/50 (!) 85/64 94/64  Pulse: 75 76 (!) 122 75  Resp:  16 18 16   Temp:  97.9 F (36.6 C) 98.3 F (36.8 C) 98.1 F (36.7 C)  TempSrc:  Oral Oral Oral  SpO2:  94% 100% 98%  Weight:  80.1 kg 79.7 kg   Height:       Physical Exam General:NAD, well appearing female, laying in bed Heart:RRR Lungs:CTAB Abdomen:soft, NTND Extremities:no LE edema Dialysis Access: LU AVF +b/t, edema decreased, no erythema   Filed Weights   04/24/19 1400 04/24/19 1733 04/24/19 2127  Weight: 84 kg 80.1 kg 79.7 kg    Intake/Output Summary (Last 24 hours) at 04/25/2019 1231 Last data filed at 04/25/2019 0754 Gross per 24 hour  Intake 480 ml  Output 3000 ml  Net -2520 ml    Additional Objective Labs: Basic Metabolic Panel: Recent Labs  Lab 04/23/19 1930 04/24/19 0440  NA 135 136  K 5.7* 5.0  CL 93* 96*  CO2 24 24  GLUCOSE 81 82  BUN 51* 53*  CREATININE 11.42* 11.61*  CALCIUM 8.7* 8.5*   Liver Function Tests: Recent Labs  Lab 04/23/19 1930  AST 12*  ALT 7  ALKPHOS 62  BILITOT 0.9  PROT 7.1  ALBUMIN 3.5   CBC: Recent Labs  Lab 04/23/19 1930 04/24/19 0440 04/25/19 0546  WBC 4.7 4.0 3.4*  NEUTROABS 2.6  --   --   HGB 13.3 12.5 12.4  HCT 44.3 40.0 39.1  MCV 100.5* 96.6 95.8  PLT 113* 102* 81*   CBG: Recent Labs  Lab 04/24/19 0543 04/24/19 2131 04/25/19 0647 04/25/19 1140  GLUCAP 80 109* 72 72   Studies/Results: DG Chest Portable 1 View  Result Date: 04/23/2019 CLINICAL DATA:  Missed dialysis EXAM: PORTABLE CHEST 1 VIEW COMPARISON:  None FINDINGS: Hazy reticular opacities throughout the lungs with fissural and septal thickening as well as cephalized,  indistinct pulmonary vascularity. No consolidative opacity. No pneumothorax or effusion. Mild cardiomegaly with a calcified aorta. Surgical clips noted at the base of the right neck and upper mediastinum. Telemetry leads overlie the chest. No acute osseous or soft tissue abnormality. IMPRESSION: Features compatible with volume overload with mild-to-moderate interstitial edema and cardiomegaly. Aortic Atherosclerosis (ICD10-I70.0). Electronically Signed   By: Lovena Le M.D.   On: 04/23/2019 21:58   [place] LT UPPER EXTREM LTD SOFT TISSUE NON VASCULAR  Result Date: 04/23/2019 CLINICAL DATA:  Left upper extremity abscess. EXAM: ULTRASOUND LEFT UPPER EXTREMITY LIMITED TECHNIQUE: Ultrasound examination of the upper extremity soft tissues was performed in the area of clinical concern. COMPARISON:  None. FINDINGS: In the patient's palpable area of concern, there is a patent dialysis fistula. There is no evidence for abscess. No sonographic abnormality detected. IMPRESSION: Grossly patent left upper extremity AV fistula without evidence for an adjacent abscess or acute sonographic abnormality. Electronically Signed   By: Constance Holster M.D.   On: 04/23/2019 22:43    Medications:  . amiodarone  200 mg Oral BID  . calcitRIOL  0.25 mcg Oral Daily  . calcium acetate  2,001 mg Oral TID WC  . calcium acetate  1,334 mg Oral BID BM  . Chlorhexidine Gluconate Cloth  6 each Topical Q0600  . doxycycline  100 mg Oral Q12H  . warfarin  7.5 mg Oral ONCE-1800  . Warfarin - Pharmacist Dosing Inpatient   Does not apply q1800    Dialysis Orders: MWF  - [place]  3.25hrs, BFR 400, DFR 800,  EDW81kg, 2K/ 2.25Ca  Access: LU AVF  Heparin 1000 Calcitriol 0.83mcg PO qHD  Assessment/Plan: 1.  LU AVF pain & swelling - US shows patent AVF w/o evidence abscess.  BC NGTD, Vanc started. Improving with antibiotics.  Tolerated full dialysis treatment using AVF.  Plan for Vanc w/HD x2weeks. 2.  ESRD -  On HD MWF. HD  tolerated well yesterday using AVF, last K 5.0 pre HD.  Plan for HD tomorrow as OP.  3.  Hypertension/volume  - Blood pressure well controlled. CXR shows interstitial edema.  Under dry post HD yesterday, 80kg.  May need to lower EDW 0.5kg. 4.  Anemia of CKD - 12.4, no indication for ESA at this time.  5.  Secondary Hyperparathyroidism -  Ca at goal. Will check phos. Continue VDRA, binders.  6.  Nutrition - currently NPO.  Renal diet w/fluid restrictions once advanced.  7. Lupus 8. A fib - coumadin held, INR 1.8 9. Dispo - ok to d/c from renal standpoint  Jen Mow, PA-C [facility] Pager: [phone] 04/25/2019,12:31 PM  LOS: 1 day

## 2019-04-25 NOTE — Progress Notes (Signed)
[facility]  Subjective  - feels ok  Objective 94/64 75 98.1 F (36.7 C) (Oral) 16 98%  Intake/Output Summary (Last 24 hours) at 04/25/2019 1115 Last data filed at 04/25/2019 0754 Gross per 24 hour  Intake 480 ml  Output 3000 ml  Net -2520 ml   Less pain over fistula redness improved  Assessment/Planning: Resolving pain over left arm avf. Successful HD yesterday Will sign off Follow up PRN  Ruta Hinds 04/25/2019 11:15 AM --  Laboratory Lab Results: Recent Labs    04/24/19 0440 04/25/19 0546  WBC 4.0 3.4*  HGB 12.5 12.4  HCT 40.0 39.1  PLT 102* 81*   BMET Recent Labs    04/23/19 1930 04/24/19 0440  NA 135 136  K 5.7* 5.0  CL 93* 96*  CO2 24 24  GLUCOSE 81 82  BUN 51* 53*  CREATININE 11.42* 11.61*  CALCIUM 8.7* 8.5*    COAG Lab Results  Component Value Date   INR 1.7 (H) 04/25/2019   INR 1.8 (H) 04/23/2019   No results found for: PTT

## 2019-04-25 NOTE — Discharge Summary (Signed)
Morgan Pope, is a 49 y.o. female  DOB Jan 29, 1971  MRN [id number].  Admission date:  04/23/2019  Admitting Physician  Rise Patience, MD  Discharge Date:  04/25/2019   Primary MD  Healthcare, [facility]  Recommendations for primary care physician for things to follow:  CBC and review of soft tissue infection Follow-up chest x-ray Follow-up blood cultures from report  Admission Diagnosis  Abscess [L02.91] Left arm pain [M79.602] Infection of arteriovenous dialysis fistula, initial encounter ([place]) [id number]] AV fistula occlusion, initial encounter [facility]) [id number]   Discharge Diagnosis  Abscess [L02.91] Left arm pain [M79.602] Infection of arteriovenous dialysis fistula, initial encounter ([place]) FG:[id number]] AV fistula occlusion, initial encounter ([place]) [id number]   Principal Problem:   AV fistula occlusion, initial encounter [facility]) Active Problems:   Chronic atrial fibrillation (HCC)   ESRD (end stage renal disease) ([place])   Hypertension   Nonischemic cardiomyopathy ([place])   SLE glomerulonephritis syndrome, WHO class VI ([place])   Left arm pain      Past Medical History:  Diagnosis Date  . Anemia in chronic kidney disease 01/21/2019  . 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 Chronic atrial fibrillation ([place]) 07/14/2014  . Dependence on renal dialysis ([place]) 01/21/2019  . Diarrhea, unspecified 01/21/2019  . 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 Disorder of phosphorus metabolism, unspecified 01/21/2019  . Dyspnea, unspecified 01/21/2019  . 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 End stage renal disease ([place]) 01/21/2019  . Hyperkalemia 01/25/2019  . Hypertension 04/04/2010  . Hypertensive chronic kidney disease with stage 5 chronic kidney disease or end stage renal disease ([place]) 01/21/2019  . Iron deficiency anemia, unspecified 01/21/2019  .  Nonischemic cardiomyopathy ([place]) 04/14/2015   EF 28%.  Followed by [employer].   EF improved to 50% with therapy.  . Osteopenia 12/30/2011   On bone density 9/13  . Pain, unspecified 01/21/2019  . Pruritus, unspecified 01/21/2019  . Renal osteodystrophy 01/21/2019  . Secondary hyperparathyroidism of renal origin ([place]) 01/21/2019  . SLE glomerulonephritis syndrome, WHO class VI ([place]) 04/13/2019  . Vitamin D deficiency, unspecified 01/21/2019    Past Surgical History:  Procedure Laterality Date  . ABDOMINAL HYSTERECTOMY    . CARDIAC SURGERY     Released fluid from heart  . THYROIDECTOMY         HPI  from the history and physical done on the day of admission:    Morgan Pope is a 49 y.o. female with history of lupus, A. fib, ESRD on hemodialysis on Monday Wednesday Friday started to experience pain in the left AV fistula area since the next day over the last 2 days.  Pain increased in intensity with no fever or chills.  Had some erythema around the fistula area.  Was unable to get her dialysis done yesterday and was referred to the ER because of the difficulty to cannulate the AV fistula.  Patient otherwise denies any chest pain shortness of breath or nausea vomiting or abdominal pain or diarrhea.  ED Course:  In the ER patient was afebrile.  Labs show potassium of 5.7 CBC unremarkable.  On-call nephrologist was consulted who requested patient to be placed on vancomycin and keep patient n.p.o. and hold off the Coumadin for now.  Ultrasound of the left upper extremity shows a patent fistula.     Hospital Course:    Possible infected AV fistula -Antibiotic initiated on admission -Follow-up on blood cultures -Vascular surgery consulted-aggressive course of 7 to 10 days of antibiotic with plans to try to cannulate the fistula and schedule patient for placement of tunneled dialysis catheter 04/25/2019, however patient was able to have fistula cannulated antiseptically underwent hemodialysis with  improvement of first soft tissue redness and pain resolution and was kept by vascular surgery for discharge  End-stage renal disease on hemodialysis Interstitial edema on x-ray of the chest on admission-ultrafiltration during dialysis Nephrology consulted for routine hemodialysis  Interstitial edema which is x-ray No evidence of CHF clinically No evidence of pulmonary infection Follow-up x-ray  2 to 3 weeks  Atrial fibrillation Coumadin was held temporarily and then space out possible surgery, however patient did quite well and surgery was not indicated per vascular surgeon      Discharge Condition: Improved and satisfactory  Follow UP Nephrologist PCP    Consults obtained -nephrology, vascular surgery  diet and Activity recommendation:  As advised  Discharge Instructions     Discharge Instructions    Call MD for:  difficulty breathing, headache or visual disturbances   Complete by: As directed    Call MD for:  extreme fatigue   Complete by: As directed    Call MD for:  hives   Complete by: As directed    Call MD for:  persistant dizziness or light-headedness   Complete by: As directed    Call MD for:  persistant nausea and vomiting   Complete by: As directed    Call MD for:  redness, tenderness, or signs of infection (pain, swelling, redness, odor or green/yellow discharge around incision site)   Complete by: As directed    Call MD for:  severe uncontrolled pain   Complete by: As directed    Call MD for:  temperature >100.4   Complete by: As directed    Diet - low sodium heart healthy   Complete by: As directed    Increase activity slowly   Complete by: As directed         Major procedures and Radiology Reports - PLEASE review detailed and final reports for all details, in brief   DG Chest Portable 1 View  Result Date: 04/23/2019 CLINICAL DATA:  Missed dialysis EXAM: PORTABLE CHEST 1 VIEW COMPARISON:  None FINDINGS: Hazy reticular opacities throughout  the lungs with fissural and septal thickening as well as cephalized, indistinct pulmonary vascularity. No consolidative opacity. No pneumothorax or effusion. Mild cardiomegaly with a calcified aorta. Surgical clips noted at the base of the right neck and upper mediastinum. Telemetry leads overlie the chest. No acute osseous or soft tissue abnormality. IMPRESSION: Features compatible with volume overload with mild-to-moderate interstitial edema and cardiomegaly. Aortic Atherosclerosis (ICD10-I70.0). Electronically Signed   By: Lovena Le M.D.   On: 04/23/2019 21:58   [place] LT UPPER EXTREM LTD SOFT TISSUE NON VASCULAR  Result Date: 04/23/2019 CLINICAL DATA:  Left upper extremity abscess. EXAM: ULTRASOUND LEFT UPPER EXTREMITY LIMITED TECHNIQUE: Ultrasound examination of the upper extremity soft tissues was performed in the area of clinical concern. COMPARISON:  None. FINDINGS: In the patient's palpable area of  concern, there is a patent dialysis fistula. There is no evidence for abscess. No sonographic abnormality detected. IMPRESSION: Grossly patent left upper extremity AV fistula without evidence for an adjacent abscess or acute sonographic abnormality. Electronically Signed   By: Constance Holster M.D.   On: 04/23/2019 22:43    Micro Results    Recent Results (from the past 240 hour(s))  Culture, blood (routine x 2)     Status: None (Preliminary result)   Collection Time: 04/23/19  6:44 PM   Specimen: BLOOD RIGHT HAND  Result Value Ref Range Status   Specimen Description BLOOD RIGHT HAND  Final   Special Requests   Final    BOTTLES DRAWN AEROBIC AND ANAEROBIC Blood Culture results may not be optimal due to an inadequate volume of blood received in culture bottles   Culture   Final    NO GROWTH 2 DAYS Performed at [facility], [place] [address] [place], [place] [address]    Report Status PENDING  Incomplete  Respiratory Panel by RT PCR (Flu A&B, Covid) - Nasopharyngeal Swab     Status: None    Collection Time: 04/23/19  7:12 PM   Specimen: Nasopharyngeal Swab  Result Value Ref Range Status   SARS Coronavirus 2 by RT PCR NEGATIVE NEGATIVE Final    Comment: (NOTE) SARS-CoV-2 target nucleic acids are NOT DETECTED. The SARS-CoV-2 RNA is generally detectable in upper respiratoy specimens during the acute phase of infection. The lowest concentration of SARS-CoV-2 viral copies this assay can detect is 131 copies/mL. A negative result does not preclude SARS-Cov-2 infection and should not be used as the sole basis for treatment or other patient management decisions. A negative result may occur with  improper specimen collection/handling, submission of specimen other than nasopharyngeal swab, presence of viral mutation(s) within the areas targeted by this assay, and inadequate number of viral copies (<131 copies/mL). A negative result must be combined with clinical observations, patient history, and epidemiological information. The expected result is Negative. Fact Sheet for Patients:  PinkCheek.be Fact Sheet for Healthcare Providers:  GravelBags.it This test is not yet ap proved or cleared by the [place] FDA and  has been authorized for detection and/or diagnosis of SARS-CoV-2 by FDA under an Emergency Use Authorization (EUA). This EUA will remain  in effect (meaning this test can be used) for the duration of the COVID-19 declaration under Section 564(b)(1) of the Act, 21 U.S.C. section 360bbb-3(b)(1), unless the authorization is terminated or revoked sooner.    Influenza A by PCR NEGATIVE NEGATIVE Final   Influenza B by PCR NEGATIVE NEGATIVE Final    Comment: (NOTE) The Xpert Xpress SARS-CoV-2/FLU/RSV assay is intended as an aid in  the diagnosis of influenza from Nasopharyngeal swab specimens and  should not be used as a sole basis for treatment. Nasal washings and  aspirates are unacceptable for Xpert Xpress  SARS-CoV-2/FLU/RSV  testing. Fact Sheet for Patients: PinkCheek.be Fact Sheet for Healthcare Providers: GravelBags.it This test is not yet approved or cleared by the [place] FDA and  has been authorized for detection and/or diagnosis of SARS-CoV-2 by  FDA under an Emergency Use Authorization (EUA). This EUA will remain  in effect (meaning this test can be used) for the duration of the  Covid-19 declaration under Section 564(b)(1) of the Act, 21  U.S.C. section 360bbb-3(b)(1), unless the authorization is  terminated or revoked. Performed at [facility], [place] [address] [place], [place] [address]   Culture, blood (routine  x 2)     Status: None (Preliminary result)   Collection Time: 04/23/19  8:51 PM   Specimen: BLOOD  Result Value Ref Range Status   Specimen Description BLOOD SITE NOT SPECIFIED  Final   Special Requests   Final    BOTTLES DRAWN AEROBIC ONLY Blood Culture adequate volume   Culture   Final    NO GROWTH 2 DAYS Performed at [facility], [address], [place], [place] [address]    Report Status PENDING  Incomplete  Surgical pcr screen     Status: None   Collection Time: 04/24/19 12:44 AM   Specimen: Nasal Mucosa; Nasal Swab  Result Value Ref Range Status   MRSA, PCR NEGATIVE NEGATIVE Final   Staphylococcus aureus NEGATIVE NEGATIVE Final    Comment: (NOTE) The Xpert SA Assay (FDA approved for NASAL specimens in patients 48 years of age and older), is one component of a comprehensive surveillance program. It is not intended to diagnose infection nor to guide or monitor treatment. Performed at [facility], [place] [address], [place], [place] [address]        Today   Subjective    Morgan Pope today has no arm pain, no fever chills feeling well and wants to go home        Patient has been seen and examined prior to discharge   Objective   Blood pressure 94/64, pulse  75, temperature 98.1 F (36.7 C), temperature source Oral, resp. rate 16, height 5\' 9"  (1.753 m), weight 79.7 kg, SpO2 98 %.   Intake/Output Summary (Last 24 hours) at 04/25/2019 1056 Last data filed at 04/25/2019 0754 Gross per 24 hour  Intake 480 ml  Output 3000 ml  Net -2520 ml    Exam Gen:- Awake   HEENT:- [place].AT, MMM Neck-Supple Neck,No JVD,  Lungs- mostly clear CV- S1, S2 normal Abd-  +ve B.Sounds, Abd Soft, No tenderness,    Extremity/Skin:- Intact peripheral pulses, near resolution of arm redness noted that with any tenderness   Data Review   CBC w Diff:  Lab Results  Component Value Date   WBC 3.4 (L) 04/25/2019   HGB 12.4 04/25/2019   HCT 39.1 04/25/2019   PLT 81 (L) 04/25/2019   LYMPHOPCT 33 04/23/2019   MONOPCT 11 04/23/2019   EOSPCT 1 04/23/2019   BASOPCT 0 04/23/2019    CMP:  Lab Results  Component Value Date   NA 136 04/24/2019   K 5.0 04/24/2019   CL 96 (L) 04/24/2019   CO2 24 04/24/2019   BUN 53 (H) 04/24/2019   CREATININE 11.61 (H) 04/24/2019   PROT 7.1 04/23/2019   ALBUMIN 3.5 04/23/2019   BILITOT 0.9 04/23/2019   ALKPHOS 62 04/23/2019   AST 12 (L) 04/23/2019   ALT 7 04/23/2019  .   Total Discharge time is about 33 minutes  Benito Mccreedy M.D on 04/25/2019 at 10:56 AM  [facility]   Office  [phone]  Dragon dictation system was used to create this note, attempts have been made to correct errors, however presence of uncorrected errors is not a reflection quality of care provided

## 2019-04-26 DIAGNOSIS — Z5181 Encounter for therapeutic drug level monitoring: Secondary | ICD-10-CM

## 2019-04-26 DIAGNOSIS — Z23 Encounter for immunization: Secondary | ICD-10-CM | POA: Insufficient documentation

## 2019-04-26 HISTORY — DX: Encounter for therapeutic drug level monitoring: Z51.81

## 2019-04-28 LAB — CULTURE, BLOOD (ROUTINE X 2)
Culture: NO GROWTH
Culture: NO GROWTH
Special Requests: ADEQUATE

## 2019-05-11 ENCOUNTER — Ambulatory Visit (INDEPENDENT_AMBULATORY_CARE_PROVIDER_SITE_OTHER): Payer: Medicare Other | Admitting: Pharmacist

## 2019-05-11 ENCOUNTER — Other Ambulatory Visit: Payer: Self-pay

## 2019-05-11 DIAGNOSIS — I482 Chronic atrial fibrillation, unspecified: Secondary | ICD-10-CM

## 2019-05-11 DIAGNOSIS — Z7901 Long term (current) use of anticoagulants: Secondary | ICD-10-CM

## 2019-05-11 LAB — POCT INR: INR: 3.7 — AB (ref 2.0–3.0)

## 2019-05-11 NOTE — Patient Instructions (Signed)
Continue taking 5 mg (1 tablet) daily except 2.5mg  (1/2 tablet) on Tuesdays and Thursdays. Recheck INR in 2 weeks.

## 2019-05-21 DIAGNOSIS — R05 Cough: Secondary | ICD-10-CM

## 2019-05-21 DIAGNOSIS — R439 Unspecified disturbances of smell and taste: Secondary | ICD-10-CM

## 2019-05-21 DIAGNOSIS — R059 Cough, unspecified: Secondary | ICD-10-CM

## 2019-05-21 HISTORY — DX: Unspecified disturbances of smell and taste: R43.9

## 2019-05-21 HISTORY — DX: Cough: R05

## 2019-05-21 HISTORY — DX: Cough, unspecified: R05.9

## 2019-06-01 ENCOUNTER — Other Ambulatory Visit: Payer: Medicare Other

## 2019-06-07 DIAGNOSIS — Z8616 Personal history of COVID-19: Secondary | ICD-10-CM

## 2019-06-07 HISTORY — DX: Personal history of COVID-19: Z86.16

## 2019-07-19 ENCOUNTER — Other Ambulatory Visit: Payer: Self-pay

## 2019-07-20 ENCOUNTER — Ambulatory Visit (INDEPENDENT_AMBULATORY_CARE_PROVIDER_SITE_OTHER): Payer: Medicare Other | Admitting: Cardiology

## 2019-07-20 ENCOUNTER — Ambulatory Visit (INDEPENDENT_AMBULATORY_CARE_PROVIDER_SITE_OTHER): Payer: Medicare Other | Admitting: Pharmacist

## 2019-07-20 ENCOUNTER — Encounter: Payer: Self-pay | Admitting: Cardiology

## 2019-07-20 ENCOUNTER — Other Ambulatory Visit: Payer: Self-pay

## 2019-07-20 ENCOUNTER — Ambulatory Visit (INDEPENDENT_AMBULATORY_CARE_PROVIDER_SITE_OTHER): Payer: Medicare Other

## 2019-07-20 ENCOUNTER — Other Ambulatory Visit (INDEPENDENT_AMBULATORY_CARE_PROVIDER_SITE_OTHER): Payer: Medicare Other

## 2019-07-20 VITALS — BP 105/69 | HR 68 | Ht 69.0 in | Wt 179.0 lb

## 2019-07-20 DIAGNOSIS — Z7901 Long term (current) use of anticoagulants: Secondary | ICD-10-CM

## 2019-07-20 DIAGNOSIS — I350 Nonrheumatic aortic (valve) stenosis: Secondary | ICD-10-CM | POA: Diagnosis not present

## 2019-07-20 DIAGNOSIS — I050 Rheumatic mitral stenosis: Secondary | ICD-10-CM

## 2019-07-20 DIAGNOSIS — I482 Chronic atrial fibrillation, unspecified: Secondary | ICD-10-CM

## 2019-07-20 DIAGNOSIS — I10 Essential (primary) hypertension: Secondary | ICD-10-CM | POA: Diagnosis not present

## 2019-07-20 LAB — POCT INR: INR: 4 — AB (ref 2.0–3.0)

## 2019-07-20 NOTE — Progress Notes (Signed)
Complete echocardiogram has been performed.  Jimmy Jaiven [place]ine RDCS, RVT 

## 2019-07-20 NOTE — Progress Notes (Signed)
Cardiology Office Note:    Date:  07/20/2019   ID:  Morgan Pope, DOB [birth year], MRN [id number]  PCP:  [facility]  Cardiologist:  Berniece Salines, DO  Electrophysiologist:  None   Referring MD: [facility]   Chief Complaint  Patient presents with  . Follow-up   History of Present Illness:    Morgan Pope is a 49 y.o. female with a hx of atrial fibrillation (diagnosed in 2017 patient is currently on warfarin and amiodarone 200 mg daily), nonischemic cardiomyopathy most recent ejection fraction in September 2019 was reported at rate of 55%, lupus which was diagnosed in her 66s, ESRD on hemodialysis Monday Wednesday and Fridays first dialysis was back in July 2000, hypertensive heart disease however patient is no longer antihypertensive due to significant hypotension postdialysis and now take midodrine.  I did see the patient on 04/13/2019 at that time, we set the patient with our coumadin clinic. She has started and she is happy.   Today she is here for a follow up visit. She offers no complaints today.   Past Medical History:  Diagnosis Date  . Anemia in chronic kidney disease 01/21/2019  . Anticoagulant long-term use 04/13/2019  . 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 AV fistula occlusion, initial encounter ([place]) 04/24/2019  . Chronic atrial fibrillation ([place]) 07/14/2014  . Cough 05/21/2019  . Cutaneous abscess, unspecified 04/23/2019  . Dependence on renal dialysis ([place]) 01/21/2019  . Diarrhea, unspecified 01/21/2019  . 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 Disorder of phosphorus metabolism, unspecified 01/21/2019  . Dyspnea, unspecified 01/21/2019  . 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 Encounter for therapeutic drug level monitoring 04/26/2019  . End stage renal disease ([place]) 01/21/2019  . ESRD (end stage renal disease) ([place]) 01/21/2019  . Hyperkalemia 01/25/2019  . Hypertension 04/04/2010  .  Hypertensive chronic kidney disease with stage 5 chronic kidney disease or end stage renal disease ([place]) 01/21/2019  . Infection and inflammatory reaction due to other cardiac and vascular devices, implants and grafts, initial encounter ([place]) 04/23/2019  . Iron deficiency anemia, unspecified 01/21/2019  . Mitral valve stenosis 04/13/2019  . Nonischemic cardiomyopathy ([place]) 04/14/2015   EF 28%.  Followed by [employer] [facility].   EF improved to 50% with therapy.  . Osteopenia 12/30/2011   On bone density 9/13  . Other specified complication of vascular prosthetic devices, implants and grafts, initial encounter ([place]) 04/23/2019  . Pain in left arm 04/23/2019  . Pain, unspecified 01/21/2019  . Personal history of covid-19 06/07/2019  . Pruritus, unspecified 01/21/2019  . Renal osteodystrophy 01/21/2019  . Secondary hyperparathyroidism of renal origin ([place]) 01/21/2019  . SLE glomerulonephritis syndrome, WHO class VI ([place]) 04/13/2019  . Unspecified disturbances of smell and taste 05/21/2019  . Vitamin D deficiency, unspecified 01/21/2019    Past Surgical History:  Procedure Laterality Date  . ABDOMINAL HYSTERECTOMY    . CARDIAC SURGERY     Released fluid from heart  . THYROIDECTOMY      Current Medications: Current Meds  Medication Sig  . amiodarone (PACERONE) 200 MG tablet Take 200 mg by mouth 2 (two) times daily.  . calcitRIOL (ROCALTROL) 0.25 MCG capsule Take 0.25 mcg by mouth daily.   . Calcium Acetate 667 MG TABS Take 2,001 mg by mouth 3 (three) times daily with meals. And take 1334mg  with snacks  . Cholecalciferol 50 MCG (2000 UT) TABS Take 2,000 Units by mouth daily.   . fluticasone (FLONASE) 50 MCG/ACT nasal  spray Place 2 sprays into both nostrils 2 (two) times daily.  Marland Kitchen loratadine (CLARITIN REDITABS) 10 MG dissolvable tablet Take 10 mg by mouth daily.  . midodrine (PROAMATINE) 10 MG tablet Take 10 mg by mouth 3 (three) times a week. Monday, Wednesday, Friday  . omeprazole (PRILOSEC) 20 MG capsule Take 20  mg by mouth daily.  Marland Kitchen warfarin (COUMADIN) 5 MG tablet Take 5 mg by mouth daily.     Allergies:   Vancomycin, Codeine, Penicillins, Fentanyl, and Phoslo [calcium acetate (phos binder)]   Social History   Socioeconomic History  . Marital status: Divorced    Spouse name: Not on file  . Number of children: Not on file  . Years of education: Not on file  . Highest education level: Not on file  Occupational History  . Not on file  Tobacco Use  . Smoking status: Current Every Day Smoker    Types: Cigarettes  . Smokeless tobacco: Never Used  Substance and Sexual Activity  . Alcohol use: Not Currently  . Drug use: Never  . Sexual activity: Not on file  Other Topics Concern  . Not on file  Social History Narrative  . Not on file   Social Determinants of Health   Financial Resource Strain:   . Difficulty of Paying Living Expenses:   Food Insecurity:   . Worried About Charity fundraiser in the Last Year:   . Arboriculturist in the Last Year:   Transportation Needs:   . Film/video editor (Medical):   Marland Kitchen Lack of Transportation (Non-Medical):   Physical Activity:   . Days of Exercise per Week:   . Minutes of Exercise per Session:   Stress:   . Feeling of Stress :   Social Connections:   . Frequency of Communication with Friends and Family:   . Frequency of Social Gatherings with Friends and Family:   . Attends Religious Services:   . Active Member of Clubs or Organizations:   . Attends Archivist Meetings:   Marland Kitchen Marital Status:      Family History: The patient's family history includes Congestive Heart Failure in her mother; Dementia in her mother; Diabetes in her mother and sister; Heart attack in her brother; Renal Disease in her mother and sister.  ROS:   Review of Systems  Constitution: Negative for decreased appetite, fever and weight gain.  HENT: Negative for congestion, ear discharge, hoarse voice and sore throat.   Eyes: Negative for discharge,  redness, vision loss in right eye and visual halos.  Cardiovascular: Negative for chest pain, dyspnea on exertion, leg swelling, orthopnea and palpitations.  Respiratory: Negative for cough, hemoptysis, shortness of breath and snoring.   Endocrine: Negative for heat intolerance and polyphagia.  Hematologic/Lymphatic: Negative for bleeding problem. Does not bruise/bleed easily.  Skin: Negative for flushing, nail changes, rash and suspicious lesions.  Musculoskeletal: Negative for arthritis, joint pain, muscle cramps, myalgias, neck pain and stiffness.  Gastrointestinal: Negative for abdominal pain, bowel incontinence, diarrhea and excessive appetite.  Genitourinary: Negative for decreased libido, genital sores and incomplete emptying.  Neurological: Negative for brief paralysis, focal weakness, headaches and loss of balance.  Psychiatric/Behavioral: Negative for altered mental status, depression and suicidal ideas.  Allergic/Immunologic: Negative for HIV exposure and persistent infections.    EKGs/Labs/Other Studies Reviewed:    The following studies were reviewed today:   EKG:  The ekg ordered today demonstrates    TTE Impression 07/20/19 1. Left ventricular ejection fraction, by  estimation, is 60 to 65%. The  left ventricle has normal function. The left ventricle has no regional  wall motion abnormalities. There is mild left ventricular hypertrophy. Left ventricular diastolic parameters are indeterminate.  2. Right ventricular systolic function is normal. The right ventricular size is normal. There is normal pulmonary artery systolic pressure.  3. Left atrial size was severely dilated.  4. The mitral valve is normal in structure. No evidence of mitral valve regurgitation. Mild mitral stenosis.  5. The aortic valve is normal in structure. Aortic valve regurgitation is mild. No aortic stenosis is present.  6. The inferior vena cava is normal in size with greater than 50% respiratory  variability, suggesting right atrial pressure of 3 mmHg.   Recent Labs:  04/23/2019: ALT 7 04/24/2019: BUN 53; Creatinine, Ser 11.61; Potassium 5.0; Sodium 136 04/25/2019: Hemoglobin 12.4; Platelets 81  Recent Lipid Panel No results found for: CHOL, TRIG, HDL, CHOLHDL, VLDL, LDLCALC, LDLDIRECT  Physical Exam:    VS:  BP 105/69 (BP Location: Right Arm, Patient Position: Sitting, Cuff Size: Normal)   Pulse 68   Ht 5\' 9"  (1.753 m)   Wt 179 lb (81.2 kg)   BMI 26.43 kg/m     Wt Readings from Last 3 Encounters:  07/20/19 179 lb (81.2 kg)  04/24/19 175 lb 12.8 oz (79.7 kg)  04/13/19 181 lb (82.1 kg)     GEN: Well nourished, well developed in no acute distress HEENT: Normal NECK: No JVD; No carotid bruits LYMPHATICS: No lymphadenopathy CARDIAC: S1S2 noted,RRR, no murmurs, rubs, gallops RESPIRATORY:  Clear to auscultation without rales, wheezing or rhonchi  ABDOMEN: Soft, non-tender, non-distended, +bowel sounds, no guarding. EXTREMITIES: No edema, No cyanosis, no clubbing MUSCULOSKELETAL:  No deformity  SKIN: Warm and dry NEUROLOGIC:  Alert and oriented x 3, non-focal PSYCHIATRIC:  Normal affect, good insight  ASSESSMENT:    1. Mild aortic stenosis   2. Mild mitral stenosis   3. Chronic atrial fibrillation ([place])   4. Essential hypertension    PLAN:     1. She is stable from a cardiovascular standpoint. No medication changes today. I discussed her echo with her. She offered no questions. She will continue to follow up with coumadin.   The patient is in agreement with the above plan. The patient left the office in stable condition.  The patient will follow up in 6 months.    Medication Adjustments/Labs and Tests Ordered: Current medicines are reviewed at length with the patient today.  Concerns regarding medicines are outlined above.  No orders of the defined types were placed in this encounter.  No orders of the defined types were placed in this encounter.   Patient  Instructions  Medication Instructions:  Your physician recommends that you continue on your current medications as directed. Please refer to the Current Medication list given to you today.  *If you need a refill on your cardiac medications before your next appointment, please call your pharmacy*   Lab Work: Your physician recommends that you continue on your current medications as directed. Please refer to the Current Medication list given to you today.  If you have labs (blood work) drawn today and your tests are completely normal, you will receive your results only by: Marland Kitchen MyChart Message (if you have MyChart) OR . A paper copy in the mail If you have any lab test that is abnormal or we need to change your treatment, we will call you to review the results.   Testing/Procedures: NONE  Follow-Up: At [facility], you and your health needs are our priority.  As part of our continuing mission to provide you with exceptional heart care, we have created designated Provider Care Teams.  These Care Teams include your primary Cardiologist (physician) and Advanced Practice Providers (APPs -  Physician Assistants and Nurse Practitioners) who all work together to provide you with the care you need, when you need it.  We recommend signing up for the patient portal called "MyChart".  Sign up information is provided on this After Visit Summary.  MyChart is used to connect with patients for Virtual Visits (Telemedicine).  Patients are able to view lab/test results, encounter notes, upcoming appointments, etc.  Non-urgent messages can be sent to your provider as well.   To learn more about what you can do with MyChart, go to [email or web address].    Your next appointment:   6 month(s)  The format for your next appointment:   In Person  Provider:   Berniece Salines, DO       Adopting a Healthy Lifestyle.  Know what a healthy weight is for you (roughly BMI <25) and aim to maintain this   Aim  for 7+ servings of fruits and vegetables daily   65-80+ fluid ounces of water or unsweet tea for healthy kidneys   Limit to max 1 drink of alcohol per day; avoid smoking/tobacco   Limit animal fats in diet for cholesterol and heart health - choose grass fed whenever available   Avoid highly processed foods, and foods high in saturated/trans fats   Aim for low stress - take time to unwind and care for your mental health   Aim for 150 min of moderate intensity exercise weekly for heart health, and weights twice weekly for bone health   Aim for 7-9 hours of sleep daily   When it comes to diets, agreement about the perfect plan isnt easy to find, even among the experts. Experts at the [place] developed an idea known as the Healthy Eating Plate. Just imagine a plate divided into logical, healthy portions.   The emphasis is on diet quality:   Load up on vegetables and fruits - one-half of your plate: Aim for color and variety, and remember that potatoes dont count.   Go for whole grains - one-quarter of your plate: Whole wheat, barley, wheat berries, quinoa, oats, brown rice, and foods made with them. If you want pasta, go with whole wheat pasta.   Protein power - one-quarter of your plate: Fish, chicken, beans, and nuts are all healthy, versatile protein sources. Limit red meat.   The diet, however, does go beyond the plate, offering a few other suggestions.   Use healthy plant oils, such as olive, canola, soy, corn, sunflower and peanut. Check the labels, and avoid partially hydrogenated oil, which have unhealthy trans fats.   If youre thirsty, drink water. Coffee and tea are good in moderation, but skip sugary drinks and limit milk and dairy products to one or two daily servings.   The type of carbohydrate in the diet is more important than the amount. Some sources of carbohydrates, such as vegetables, fruits, whole grains, and beans-are healthier than others.    Finally, stay active  Signed, Berniece Salines, DO  07/20/2019 12:25 PM    [facility]

## 2019-07-20 NOTE — Patient Instructions (Signed)
Medication Instructions:  Your physician recommends that you continue on your current medications as directed. Please refer to the Current Medication list given to you today.  *If you need a refill on your cardiac medications before your next appointment, please call your pharmacy*   Lab Work: Your physician recommends that you continue on your current medications as directed. Please refer to the Current Medication list given to you today.  If you have labs (blood work) drawn today and your tests are completely normal, you will receive your results only by: Marland Kitchen MyChart Message (if you have MyChart) OR . A paper copy in the mail If you have any lab test that is abnormal or we need to change your treatment, we will call you to review the results.   Testing/Procedures: NONE   Follow-Up: At [facility], you and your health needs are our priority.  As part of our continuing mission to provide you with exceptional heart care, we have created designated Provider Care Teams.  These Care Teams include your primary Cardiologist (physician) and Advanced Practice Providers (APPs -  Physician Assistants and Nurse Practitioners) who all work together to provide you with the care you need, when you need it.  We recommend signing up for the patient portal called "MyChart".  Sign up information is provided on this After Visit Summary.  MyChart is used to connect with patients for Virtual Visits (Telemedicine).  Patients are able to view lab/test results, encounter notes, upcoming appointments, etc.  Non-urgent messages can be sent to your provider as well.   To learn more about what you can do with MyChart, go to [email or web address].    Your next appointment:   6 month(s)  The format for your next appointment:   In Person  Provider:   Berniece Salines, DO

## 2019-07-20 NOTE — Patient Instructions (Signed)
Description   Skip Coumadin today, then continue taking 5 mg (1 tablet) daily except 2.5mg  (1/2 tablet) on Tuesdays and Thursdays. Recheck INR in 2 weeks.

## 2019-08-10 ENCOUNTER — Other Ambulatory Visit: Payer: Self-pay

## 2019-08-10 ENCOUNTER — Ambulatory Visit (INDEPENDENT_AMBULATORY_CARE_PROVIDER_SITE_OTHER): Payer: Medicare Other | Admitting: Pharmacist

## 2019-08-10 DIAGNOSIS — Z7901 Long term (current) use of anticoagulants: Secondary | ICD-10-CM | POA: Diagnosis not present

## 2019-08-10 DIAGNOSIS — I482 Chronic atrial fibrillation, unspecified: Secondary | ICD-10-CM

## 2019-08-10 LAB — POCT INR: INR: 3.8 — AB (ref 2.0–3.0)

## 2019-08-10 NOTE — Patient Instructions (Addendum)
Skip Coumadin today and tomorrow.  Then take 1/2 tablet on Sun/Tues/Thurs/Sat and 1 tablet on Mon/Wed/Fri

## 2019-09-07 ENCOUNTER — Ambulatory Visit (INDEPENDENT_AMBULATORY_CARE_PROVIDER_SITE_OTHER): Payer: Medicare Other | Admitting: *Deleted

## 2019-09-07 ENCOUNTER — Other Ambulatory Visit: Payer: Self-pay

## 2019-09-07 DIAGNOSIS — I482 Chronic atrial fibrillation, unspecified: Secondary | ICD-10-CM

## 2019-09-07 DIAGNOSIS — Z7901 Long term (current) use of anticoagulants: Secondary | ICD-10-CM

## 2019-09-07 LAB — POCT INR: INR: 1.8 — AB (ref 2.0–3.0)

## 2019-09-07 NOTE — Patient Instructions (Signed)
Take warfarin 1 tablet tonight then increase dose to 1 tablet daily except 1/2 tablet on Sundays, Tuesdays and Thursdays Recheck in 3 wks

## 2019-10-19 ENCOUNTER — Ambulatory Visit (INDEPENDENT_AMBULATORY_CARE_PROVIDER_SITE_OTHER): Payer: Medicare Other

## 2019-10-19 ENCOUNTER — Other Ambulatory Visit: Payer: Self-pay

## 2019-10-19 DIAGNOSIS — Z5181 Encounter for therapeutic drug level monitoring: Secondary | ICD-10-CM | POA: Diagnosis not present

## 2019-10-19 DIAGNOSIS — Z7901 Long term (current) use of anticoagulants: Secondary | ICD-10-CM

## 2019-10-19 DIAGNOSIS — I482 Chronic atrial fibrillation, unspecified: Secondary | ICD-10-CM

## 2019-10-19 LAB — POCT INR: INR: 1.7 — AB (ref 2.0–3.0)

## 2019-10-19 NOTE — Patient Instructions (Signed)
Take warfarin 1.5 tablets tonight then continue taking 1 tablet daily except 1/2 tablet on Sundays, Tuesdays and Thursdays Recheck in 2 wks

## 2019-11-02 ENCOUNTER — Other Ambulatory Visit: Payer: Self-pay

## 2019-11-02 ENCOUNTER — Ambulatory Visit (INDEPENDENT_AMBULATORY_CARE_PROVIDER_SITE_OTHER): Payer: Medicare Other

## 2019-11-02 DIAGNOSIS — Z7901 Long term (current) use of anticoagulants: Secondary | ICD-10-CM

## 2019-11-02 DIAGNOSIS — Z5181 Encounter for therapeutic drug level monitoring: Secondary | ICD-10-CM | POA: Diagnosis not present

## 2019-11-02 DIAGNOSIS — I482 Chronic atrial fibrillation, unspecified: Secondary | ICD-10-CM | POA: Diagnosis not present

## 2019-11-02 LAB — POCT INR: INR: 5.6 — AB (ref 2.0–3.0)

## 2019-11-02 NOTE — Patient Instructions (Signed)
Hold today and tomorrow and then continue taking 1 tablet daily except 1/2 tablet on Sundays, Tuesdays and Thursdays Recheck in 1 wk

## 2019-11-09 ENCOUNTER — Other Ambulatory Visit: Payer: Self-pay

## 2019-11-09 ENCOUNTER — Ambulatory Visit (INDEPENDENT_AMBULATORY_CARE_PROVIDER_SITE_OTHER): Payer: Medicare Other | Admitting: Pharmacist

## 2019-11-09 ENCOUNTER — Telehealth: Payer: Self-pay | Admitting: Pharmacist

## 2019-11-09 DIAGNOSIS — I482 Chronic atrial fibrillation, unspecified: Secondary | ICD-10-CM | POA: Diagnosis not present

## 2019-11-09 DIAGNOSIS — Z7901 Long term (current) use of anticoagulants: Secondary | ICD-10-CM

## 2019-11-09 LAB — POCT INR: INR: 6.4 — AB (ref 2.0–3.0)

## 2019-11-09 NOTE — Telephone Encounter (Signed)
-----   Message from Berniece Salines, DO sent at 11/09/2019 11:08 AM EDT ----- Regarding: RE: anticoag  I think that should be fine Geoge Lawrance. Thank you for checking. ----- Message ----- From: Leeroy Bock, RPH-CPP Sent: 11/09/2019  10:36 AM EDT To: Berniece Salines, DO Subject: anticoag                                       Hi Dr Harriet Masson,  Ms Lynn had an INR check today and inquired about changing to Eliquis. She takes warfarin for afib but her INR has been difficult to manage and she's been supratherapeutic > 5 for her past few checks. She's on dialysis however Eliquis is now recommended for use in afib for patients on dialysis with better safety data than warfarin. I wanted to double check with you since she also has a history of lupus and embolism of her retinal artery. Since her main anticoag indication is the afib, I'm hoping we'll be able to switch her to Eliquis if you're ok with it.  Thanks! Jinny Blossom

## 2019-11-09 NOTE — Patient Instructions (Signed)
Description   Hold today and tomorrow and then take 1/2 tablet (2.5 mg ) on Thursday, Friday, Saturday and Sunday.  Take 1 tablet 5 mg on Monday.  Recheck in 1 week.

## 2019-11-09 NOTE — Telephone Encounter (Signed)
Will wait for stat INR to result before changing pt to Eliquis 5mg  BID for afib indication (age < 4, weight > 60kg, on dialysis). She has Medicaid so copay will be $8.95 for 3 month supply.

## 2019-11-10 ENCOUNTER — Telehealth: Payer: Self-pay

## 2019-11-10 DIAGNOSIS — Z7901 Long term (current) use of anticoagulants: Secondary | ICD-10-CM

## 2019-11-10 DIAGNOSIS — I482 Chronic atrial fibrillation, unspecified: Secondary | ICD-10-CM

## 2019-11-10 LAB — PROTIME-INR
INR: 4.9 — ABNORMAL HIGH (ref 0.9–1.2)
Prothrombin Time: 48.2 s — ABNORMAL HIGH (ref 9.1–12.0)

## 2019-11-10 MED ORDER — APIXABAN 5 MG PO TABS: 5.0000 mg | ORAL_TABLET | Freq: Two times a day (BID) | ORAL | 1 refills | Status: DC

## 2019-11-10 NOTE — Telephone Encounter (Signed)
-----   Message from Berniece Salines, DO sent at 11/10/2019 10:17 AM EDT -----  Please have the patient hold her Coumadin today.   She will need a repeat INR  tomorrow. I know  that the Coumadin Clinic is going to be working with her to transition her to [place].  Please ask her if she is discussed with the pharmacist about this.

## 2019-11-10 NOTE — Telephone Encounter (Signed)
Stat INR back at 4.9. Pt skipped warfarin yesterday. She is aware to stop warfarin completely and will start Eliquis 5mg  BID with first dose on 8/26 in the evening.

## 2019-11-10 NOTE — Telephone Encounter (Signed)
Spoke with patient regarding results and recommendation.  Patient verbalizes understanding and is agreeable to plan of care. Advised patient to call back with any issues or concerns.   Patient states that she has been in contact with the coumadin clinic and that she has everything set up that she needs at this time.

## 2019-11-10 NOTE — Addendum Note (Signed)
Addended by: Alexis Mizuno E on: 11/10/2019 08:47 AM   Modules accepted: Orders

## 2019-12-07 ENCOUNTER — Telehealth: Payer: Self-pay

## 2019-12-07 NOTE — Telephone Encounter (Signed)
Spoke to patient

## 2019-12-27 DIAGNOSIS — T82590A Other mechanical complication of surgically created arteriovenous fistula, initial encounter: Secondary | ICD-10-CM | POA: Insufficient documentation

## 2019-12-27 DIAGNOSIS — E8779 Other fluid overload: Secondary | ICD-10-CM | POA: Insufficient documentation

## 2020-01-20 ENCOUNTER — Other Ambulatory Visit: Payer: Self-pay

## 2020-01-20 ENCOUNTER — Ambulatory Visit (INDEPENDENT_AMBULATORY_CARE_PROVIDER_SITE_OTHER): Payer: Medicare Other | Admitting: Cardiology

## 2020-01-20 ENCOUNTER — Encounter: Payer: Self-pay | Admitting: Cardiology

## 2020-01-20 VITALS — BP 102/60 | HR 60 | Ht 69.0 in | Wt 171.4 lb

## 2020-01-20 DIAGNOSIS — I10 Essential (primary) hypertension: Secondary | ICD-10-CM | POA: Diagnosis not present

## 2020-01-20 DIAGNOSIS — I482 Chronic atrial fibrillation, unspecified: Secondary | ICD-10-CM | POA: Diagnosis not present

## 2020-01-20 DIAGNOSIS — I350 Nonrheumatic aortic (valve) stenosis: Secondary | ICD-10-CM | POA: Diagnosis not present

## 2020-01-20 NOTE — Patient Instructions (Signed)

## 2020-01-20 NOTE — Progress Notes (Signed)
Cardiology Office Note:    Date:  01/20/2020   ID:  Morgan Pope, DOB October 17, 1970, MRN [id number]  PCP:  [facility]  Cardiologist:  Berniece Salines, DO  Electrophysiologist:  None   Referring MD: [facility]   " I am doing well"  History of Present Illness:    Morgan Pope is a 49 y.o. female with a hx of chronic atrial fibrillation(diagnosed in 2017 patient is currently on warfarin and amiodarone 200 mg daily), nonischemic cardiomyopathy most recent ejection fraction in September 2019 was reported at rate of 55%, lupus which was diagnosed in her 13s, ESRD on hemodialysis Monday Wednesday and Fridays first dialysis was back in July 2000, hypertensive heart disease.  I saw the patient in May 2021 at that time she was doing well from a cardiovascular standpoint.  Today she tells me she has been doing well she offers no complaints at this time.  She is happy with her cardiovascular health.  She is looking forward to the holidays and spent time with her family.  No complaints today.   Past Medical History:  Diagnosis Date  . Anemia in chronic kidney disease 01/21/2019  . Anticoagulant long-term use 04/13/2019  . 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 AV fistula occlusion, initial encounter ([place]) 04/24/2019  . Chronic atrial fibrillation ([place]) 07/14/2014  . Cough 05/21/2019  . Cutaneous abscess, unspecified 04/23/2019  . Dependence on renal dialysis ([place]) 01/21/2019  . Diarrhea, unspecified 01/21/2019  . 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 Disorder of phosphorus metabolism, unspecified 01/21/2019  . Dyspnea, unspecified 01/21/2019  . 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 Encounter for therapeutic drug level monitoring 04/26/2019  . End stage renal disease ([place]) 01/21/2019  . ESRD (end stage renal disease) ([place]) 01/21/2019  . Hyperkalemia 01/25/2019  . Hypertension 04/04/2010  . Hypertensive chronic kidney disease  with stage 5 chronic kidney disease or end stage renal disease ([place]) 01/21/2019  . Infection and inflammatory reaction due to other cardiac and vascular devices, implants and grafts, initial encounter ([place]) 04/23/2019  . Iron deficiency anemia, unspecified 01/21/2019  . Mitral valve stenosis 04/13/2019  . Nonischemic cardiomyopathy ([place]) 04/14/2015   EF 28%.  Followed by [employer].   EF improved to 50% with therapy.  . Osteopenia 12/30/2011   On bone density 9/13  . Other specified complication of vascular prosthetic devices, implants and grafts, initial encounter ([place]) 04/23/2019  . Pain in left arm 04/23/2019  . Pain, unspecified 01/21/2019  . Personal history of COVID-19 06/07/2019  . Pruritus, unspecified 01/21/2019  . Renal osteodystrophy 01/21/2019  . Secondary hyperparathyroidism of renal origin ([place]) 01/21/2019  . SLE glomerulonephritis syndrome, WHO class VI ([place]) 04/13/2019  . Unspecified disturbances of smell and taste 05/21/2019  . Vitamin D deficiency, unspecified 01/21/2019    Past Surgical History:  Procedure Laterality Date  . ABDOMINAL HYSTERECTOMY    . CARDIAC SURGERY     Released fluid from heart  . THYROIDECTOMY      Current Medications: Current Meds  Medication Sig  . amiodarone (PACERONE) 200 MG tablet Take 200 mg by mouth 2 (two) times daily.  Marland Kitchen apixaban (ELIQUIS) 5 MG TABS tablet Take 1 tablet (5 mg total) by mouth 2 (two) times daily.  . calcitRIOL (ROCALTROL) 0.25 MCG capsule Take 0.25 mcg by mouth daily.   . Calcium Acetate 667 MG TABS Take 2,001 mg by mouth 3 (three) times daily with meals. And take 1334mg  with snacks  .  Cholecalciferol 50 MCG (2000 UT) TABS Take 2,000 Units by mouth daily.   . fluticasone (FLONASE) 50 MCG/ACT nasal spray Place 2 sprays into both nostrils 2 (two) times daily.  Marland Kitchen loratadine (CLARITIN REDITABS) 10 MG dissolvable tablet Take 10 mg by mouth daily.  . midodrine (PROAMATINE) 10 MG tablet Take 10 mg by mouth 3 (three) times a week. Monday,  Wednesday, Friday  . omeprazole (PRILOSEC) 20 MG capsule Take 20 mg by mouth daily.  Marland Kitchen warfarin (COUMADIN) 5 MG tablet Take 1 tablet by mouth daily.     Allergies:   Vancomycin, Codeine, Penicillins, Fentanyl, and Phoslo [calcium acetate (phos binder)]   Social History   Socioeconomic History  . Marital status: Divorced    Spouse name: Not on file  . Number of children: Not on file  . Years of education: Not on file  . Highest education level: Not on file  Occupational History  . Not on file  Tobacco Use  . Smoking status: Current Every Day Smoker    Types: Cigarettes  . Smokeless tobacco: Never Used  . Tobacco comment: TRYING TO STOP  Substance and Sexual Activity  . Alcohol use: Not Currently  . Drug use: Never  . Sexual activity: Not on file  Other Topics Concern  . Not on file  Social History Narrative  . Not on file   Social Determinants of Health   Financial Resource Strain:   . Difficulty of Paying Living Expenses: Not on file  Food Insecurity:   . Worried About Charity fundraiser in the Last Year: Not on file  . Ran Out of Food in the Last Year: Not on file  Transportation Needs:   . Lack of Transportation (Medical): Not on file  . Lack of Transportation (Non-Medical): Not on file  Physical Activity:   . Days of Exercise per Week: Not on file  . Minutes of Exercise per Session: Not on file  Stress:   . Feeling of Stress : Not on file  Social Connections:   . Frequency of Communication with Friends and Family: Not on file  . Frequency of Social Gatherings with Friends and Family: Not on file  . Attends Religious Services: Not on file  . Active Member of Clubs or Organizations: Not on file  . Attends Archivist Meetings: Not on file  . Marital Status: Not on file     Family History: The patient's family history includes Congestive Heart Failure in her mother; Dementia in her mother; Diabetes in her mother and sister; Heart attack in her  brother; Renal Disease in her mother and sister.  ROS:   Review of Systems  Constitution: Negative for decreased appetite, fever and weight gain.  HENT: Negative for congestion, ear discharge, hoarse voice and sore throat.   Eyes: Negative for discharge, redness, vision loss in right eye and visual halos.  Cardiovascular: Negative for chest pain, dyspnea on exertion, leg swelling, orthopnea and palpitations.  Respiratory: Negative for cough, hemoptysis, shortness of breath and snoring.   Endocrine: Negative for heat intolerance and polyphagia.  Hematologic/Lymphatic: Negative for bleeding problem. Does not bruise/bleed easily.  Skin: Negative for flushing, nail changes, rash and suspicious lesions.  Musculoskeletal: Negative for arthritis, joint pain, muscle cramps, myalgias, neck pain and stiffness.  Gastrointestinal: Negative for abdominal pain, bowel incontinence, diarrhea and excessive appetite.  Genitourinary: Negative for decreased libido, genital sores and incomplete emptying.  Neurological: Negative for brief paralysis, focal weakness, headaches and loss of balance.  Psychiatric/Behavioral: Negative for altered mental status, depression and suicidal ideas.  Allergic/Immunologic: Negative for HIV exposure and persistent infections.    EKGs/Labs/Other Studies Reviewed:    The following studies were reviewed today:   EKG:  None today  TTE IMPRESSIONS  1. Left ventricular ejection fraction, by estimation, is 60 to 65%. The left ventricle has normal function. The left ventricle has no regional wall motion abnormalities. There is mild left ventricular hypertrophy.  Left ventricular diastolic parameters are indeterminate.  2. Right ventricular systolic function is normal. The right ventricular size is normal. There is normal pulmonary artery systolic pressure.  3. Left atrial size was severely dilated.  4. The mitral valve is normal in structure. No evidence of mitral valve  regurgitation. Mild mitral stenosis.  5. The aortic valve is normal in structure. Aortic valve regurgitation is mild. No aortic stenosis is present.  6. The inferior vena cava is normal in size with greater than 50% respiratory variability, suggesting right atrial pressure of 3 mmHg.   Recent Labs: 04/23/2019: ALT 7 04/24/2019: BUN 53; Creatinine, Ser 11.61; Potassium 5.0; Sodium 136 04/25/2019: Hemoglobin 12.4; Platelets 81  Recent Lipid Panel No results found for: CHOL, TRIG, HDL, CHOLHDL, VLDL, LDLCALC, LDLDIRECT  Physical Exam:    VS:  BP 102/60   Pulse 60   Ht 5\' 9"  (1.753 m)   Wt 171 lb 6.4 oz (77.7 kg)   SpO2 93%   BMI 25.31 kg/m     Wt Readings from Last 3 Encounters:  01/20/20 171 lb 6.4 oz (77.7 kg)  07/20/19 179 lb (81.2 kg)  04/24/19 175 lb 12.8 oz (79.7 kg)     GEN: Well nourished, well developed in no acute distress HEENT: Normal NECK: No JVD; No carotid bruits LYMPHATICS: No lymphadenopathy CARDIAC: S1S2 noted,RRR, no murmurs, rubs, gallops RESPIRATORY:  Clear to auscultation without rales, wheezing or rhonchi  ABDOMEN: Soft, non-tender, non-distended, +bowel sounds, no guarding. EXTREMITIES: No edema, No cyanosis, no clubbing MUSCULOSKELETAL:  No deformity  SKIN: Warm and dry NEUROLOGIC:  Alert and oriented x 3, non-focal PSYCHIATRIC:  Normal affect, good insight  ASSESSMENT:    1. Chronic atrial fibrillation ([place])   2. Primary hypertension   3. Mild aortic stenosis    PLAN:     She is doing well from a cardiovascular standpoint her blood pressure deceptively in the office no changes will be made to her medication regimen.  She is now on Eliquis for anticoagulation for her atrial fibrillation.  She has had no signs of bleeding.  She follows with her pharmacy clinic for her medications.  The patient is in agreement with the above plan. The patient left the office in stable condition.  The patient will follow up in 6 months or sooner if  needed   Medication Adjustments/Labs and Tests Ordered: Current medicines are reviewed at length with the patient today.  Concerns regarding medicines are outlined above.  No orders of the defined types were placed in this encounter.  No orders of the defined types were placed in this encounter.   Patient Instructions  Medication Instructions:  Your physician recommends that you continue on your current medications as directed. Please refer to the Current Medication list given to you today.  *If you need a refill on your cardiac medications before your next appointment, please call your pharmacy*   Lab Work: None. If you have labs (blood work) drawn today and your tests are completely normal, you will receive your results only by: Marland Kitchen  MyChart Message (if you have MyChart) OR . A paper copy in the mail If you have any lab test that is abnormal or we need to change your treatment, we will call you to review the results.   Testing/Procedures: None   Follow-Up: At [facility], you and your health needs are our priority.  As part of our continuing mission to provide you with exceptional heart care, we have created designated Provider Care Teams.  These Care Teams include your primary Cardiologist (physician) and Advanced Practice Providers (APPs -  Physician Assistants and Nurse Practitioners) who all work together to provide you with the care you need, when you need it.  We recommend signing up for the patient portal called "MyChart".  Sign up information is provided on this After Visit Summary.  MyChart is used to connect with patients for Virtual Visits (Telemedicine).  Patients are able to view lab/test results, encounter notes, upcoming appointments, etc.  Non-urgent messages can be sent to your provider as well.   To learn more about what you can do with MyChart, go to [email or web address].    Your next appointment:   6 month(s)  The format for your next appointment:   In  Person  Provider:   Berniece Salines, DO   Other Instructions      Adopting a Healthy Lifestyle.  Know what a healthy weight is for you (roughly BMI <25) and aim to maintain this   Aim for 7+ servings of fruits and vegetables daily   65-80+ fluid ounces of water or unsweet tea for healthy kidneys   Limit to max 1 drink of alcohol per day; avoid smoking/tobacco   Limit animal fats in diet for cholesterol and heart health - choose grass fed whenever available   Avoid highly processed foods, and foods high in saturated/trans fats   Aim for low stress - take time to unwind and care for your mental health   Aim for 150 min of moderate intensity exercise weekly for heart health, and weights twice weekly for bone health   Aim for 7-9 hours of sleep daily   When it comes to diets, agreement about the perfect plan isnt easy to find, even among the experts. Experts at the [employer] developed an idea known as the Healthy Eating Plate. Just imagine a plate divided into logical, healthy portions.   The emphasis is on diet quality:   Load up on vegetables and fruits - one-half of your plate: Aim for color and variety, and remember that potatoes dont count.   Go for whole grains - one-quarter of your plate: Whole wheat, barley, wheat berries, quinoa, oats, brown rice, and foods made with them. If you want pasta, go with whole wheat pasta.   Protein power - one-quarter of your plate: Fish, chicken, beans, and nuts are all healthy, versatile protein sources. Limit red meat.   The diet, however, does go beyond the plate, offering a few other suggestions.   Use healthy plant oils, such as olive, canola, soy, corn, sunflower and peanut. Check the labels, and avoid partially hydrogenated oil, which have unhealthy trans fats.   If youre thirsty, drink water. Coffee and tea are good in moderation, but skip sugary drinks and limit milk and dairy products to one or two daily  servings.   The type of carbohydrate in the diet is more important than the amount. Some sources of carbohydrates, such as vegetables, fruits, whole grains, and beans-are healthier than others.  Finally, stay active  Signed, Berniece Salines, DO  01/20/2020 9:27 AM    [place]

## 2020-04-17 ENCOUNTER — Other Ambulatory Visit: Payer: Self-pay | Admitting: Cardiology

## 2020-04-17 NOTE — Telephone Encounter (Signed)
Called pt to confirm she is taking Eliquis and not taking warfarin. She confirmed this. Eliquis refill sent in, appropriate dialysis dosing given age/weight.

## 2020-04-17 NOTE — Telephone Encounter (Signed)
16f 77.7kg, scr 11.61 (04/24/19), lovw/tobb 01/20/20. Pt creatinine at 11.61 is cause for concern. Pt requesting '5mg'$  eliquis when they have warfarin also on med list. Will route to pharmd pool for clarification

## 2020-05-25 ENCOUNTER — Other Ambulatory Visit: Payer: Self-pay | Admitting: Student

## 2020-05-25 DIAGNOSIS — R928 Other abnormal and inconclusive findings on diagnostic imaging of breast: Secondary | ICD-10-CM

## 2020-06-06 ENCOUNTER — Ambulatory Visit
Admission: RE | Admit: 2020-06-06 | Discharge: 2020-06-06 | Disposition: A | Discharge status: Home / Self Care | Payer: Medicare Other | Source: Ambulatory Visit | Attending: Student | Admitting: Student

## 2020-06-06 ENCOUNTER — Other Ambulatory Visit: Payer: Self-pay

## 2020-06-06 DIAGNOSIS — R928 Other abnormal and inconclusive findings on diagnostic imaging of breast: Secondary | ICD-10-CM

## 2020-07-14 ENCOUNTER — Other Ambulatory Visit: Payer: Self-pay

## 2020-07-18 ENCOUNTER — Encounter: Payer: Self-pay | Admitting: Cardiology

## 2020-07-18 ENCOUNTER — Ambulatory Visit (INDEPENDENT_AMBULATORY_CARE_PROVIDER_SITE_OTHER): Payer: Medicare Other | Admitting: Cardiology

## 2020-07-18 ENCOUNTER — Other Ambulatory Visit: Payer: Self-pay

## 2020-07-18 VITALS — BP 108/60 | HR 91 | Ht 69.0 in | Wt 176.0 lb

## 2020-07-18 DIAGNOSIS — I480 Paroxysmal atrial fibrillation: Secondary | ICD-10-CM | POA: Insufficient documentation

## 2020-07-18 DIAGNOSIS — I050 Rheumatic mitral stenosis: Secondary | ICD-10-CM

## 2020-07-18 DIAGNOSIS — N186 End stage renal disease: Secondary | ICD-10-CM

## 2020-07-18 DIAGNOSIS — M3214 Glomerular disease in systemic lupus erythematosus: Secondary | ICD-10-CM | POA: Diagnosis not present

## 2020-07-18 DIAGNOSIS — I428 Other cardiomyopathies: Secondary | ICD-10-CM | POA: Diagnosis not present

## 2020-07-18 DIAGNOSIS — Z992 Dependence on renal dialysis: Secondary | ICD-10-CM

## 2020-07-18 MED ORDER — MIDODRINE HCL 10 MG PO TABS: 10.0000 mg | ORAL_TABLET | ORAL | 3 refills | Status: AC

## 2020-07-18 NOTE — Progress Notes (Signed)
Cardiology Office Note:    Date:  07/18/2020   ID:  Morgan Pope, DOB 07-26-1970, MRN [id number]  PCP:  Reita May, NP  Cardiologist:  Berniece Salines, DO  Electrophysiologist:  None   Referring MD: [facility]   I am doing fine  History of Present Illness:    Morgan Pope is a 50 y.o. female with a hx of paroxysmal atrial fibrillation(diagnosed in 2017 patient is currently on Eliquis and amiodarone 200 mg daily), nonischemic cardiomyopathy most recent ejection fraction in September 2019 was reported at rate of 55%, lupus which was diagnosed in her 71s, ESRD on hemodialysis Monday Wednesday and Fridays first dialysis was back in July 2000, hypertensive heart disease.  I saw the patient in May 2021 at that time she was doing well from a cardiovascular standpoint.  At her last visit in November 2021 the patient appeared to be doing well from a cardiovascular standpoint.  We continued her Eliquis.  She had no signs of bleeding.  She is here today for follow-up visit.  The patient offers no complaints at this time.  She has a form for her Medicaid transportation which we will going to fill out today in the office.  Past Medical History:  Diagnosis Date  . Anemia in chronic kidney disease 01/21/2019  . Anticoagulant long-term use 04/13/2019  . 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 AV fistula occlusion, initial encounter ([place]) 04/24/2019  . Chronic atrial fibrillation ([place]) 07/14/2014  . Cough 05/21/2019  . Cutaneous abscess, unspecified 04/23/2019  . Dependence on renal dialysis ([place]) 01/21/2019  . Diarrhea, unspecified 01/21/2019  . 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 Disorder of phosphorus metabolism, unspecified 01/21/2019  . Dyspnea, unspecified 01/21/2019  . 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 Encounter for therapeutic drug level monitoring 04/26/2019  . End stage renal disease ([place]) 01/21/2019  . ESRD  (end stage renal disease) ([place]) 01/21/2019  . Hyperkalemia 01/25/2019  . Hypertension 04/04/2010  . Hypertensive chronic kidney disease with stage 5 chronic kidney disease or end stage renal disease ([place]) 01/21/2019  . Infection and inflammatory reaction due to other cardiac and vascular devices, implants and grafts, initial encounter ([place]) 04/23/2019  . Iron deficiency anemia, unspecified 01/21/2019  . Mitral valve stenosis 04/13/2019  . Nonischemic cardiomyopathy ([place]) 04/14/2015   EF 28%.  Followed by [employer].   EF improved to 50% with therapy.  . Osteopenia 12/30/2011   On bone density 9/13  . Other specified complication of vascular prosthetic devices, implants and grafts, initial encounter ([place]) 04/23/2019  . Pain in left arm 04/23/2019  . Pain, unspecified 01/21/2019  . Personal history of COVID-19 06/07/2019  . Pruritus, unspecified 01/21/2019  . Renal osteodystrophy 01/21/2019  . Secondary hyperparathyroidism of renal origin ([place]) 01/21/2019  . SLE glomerulonephritis syndrome, WHO class VI ([place]) 04/13/2019  . Unspecified disturbances of smell and taste 05/21/2019  . Vitamin D deficiency, unspecified 01/21/2019    Past Surgical History:  Procedure Laterality Date  . ABDOMINAL HYSTERECTOMY    . CARDIAC SURGERY     Released fluid from heart  . THYROIDECTOMY      Current Medications: Current Meds  Medication Sig  . amiodarone (PACERONE) 200 MG tablet Take 200 mg by mouth 2 (two) times daily.  . calcitRIOL (ROCALTROL) 0.25 MCG capsule Take 0.25 mcg by mouth daily.   . Calcium Acetate 667 MG TABS Take 2,001 mg by mouth 3 (three) times daily with meals. And  take '1334mg'$  with snacks  . Cholecalciferol 50 MCG (2000 UT) TABS Take 2,000 Units by mouth daily.   Marland Kitchen ELIQUIS 5 MG TABS tablet TAKE 1 TABLET(5 MG) BY MOUTH TWICE DAILY  . fluticasone (FLONASE) 50 MCG/ACT nasal spray Place 2 sprays into both nostrils 2 (two) times daily.  Marland Kitchen loratadine (CLARITIN REDITABS) 10 MG dissolvable tablet Take 10 mg by  mouth daily.  . midodrine (PROAMATINE) 10 MG tablet Take 10 mg by mouth 3 (three) times a week. Monday, Wednesday, Friday  . [DISCONTINUED] mirtazapine (REMERON) 7.5 MG tablet Take 7.5 mg by mouth at bedtime.  . [DISCONTINUED] omeprazole (PRILOSEC) 20 MG capsule Take 20 mg by mouth daily.     Allergies:   Vancomycin, Codeine, Penicillins, Fentanyl, and Phoslo [calcium acetate (phos binder)]   Social History   Socioeconomic History  . Marital status: Divorced    Spouse name: Not on file  . Number of children: Not on file  . Years of education: Not on file  . Highest education level: Not on file  Occupational History  . Not on file  Tobacco Use  . Smoking status: Current Every Day Smoker    Types: Cigarettes  . Smokeless tobacco: Never Used  . Tobacco comment: TRYING TO STOP  Substance and Sexual Activity  . Alcohol use: Not Currently  . Drug use: Never  . Sexual activity: Not on file  Other Topics Concern  . Not on file  Social History Narrative  . Not on file   Social Determinants of Health   Financial Resource Strain: Not on file  Food Insecurity: Not on file  Transportation Needs: Not on file  Physical Activity: Not on file  Stress: Not on file  Social Connections: Not on file     Family History: The patient's family history includes Congestive Heart Failure in her mother; Dementia in her mother; Diabetes in her mother and sister; Heart attack in her brother; Renal Disease in her mother and sister.  ROS:   Review of Systems  Constitution: Negative for decreased appetite, fever and weight gain.  HENT: Negative for congestion, ear discharge, hoarse voice and sore throat.   Eyes: Negative for discharge, redness, vision loss in right eye and visual halos.  Cardiovascular: Negative for chest pain, dyspnea on exertion, leg swelling, orthopnea and palpitations.  Respiratory: Negative for cough, hemoptysis, shortness of breath and snoring.   Endocrine: Negative for heat  intolerance and polyphagia.  Hematologic/Lymphatic: Negative for bleeding problem. Does not bruise/bleed easily.  Skin: Negative for flushing, nail changes, rash and suspicious lesions.  Musculoskeletal: Negative for arthritis, joint pain, muscle cramps, myalgias, neck pain and stiffness.  Gastrointestinal: Negative for abdominal pain, bowel incontinence, diarrhea and excessive appetite.  Genitourinary: Negative for decreased libido, genital sores and incomplete emptying.  Neurological: Negative for brief paralysis, focal weakness, headaches and loss of balance.  Psychiatric/Behavioral: Negative for altered mental status, depression and suicidal ideas.  Allergic/Immunologic: Negative for HIV exposure and persistent infections.    EKGs/Labs/Other Studies Reviewed:    The following studies were reviewed today:   EKG:  The ekg ordered today demonstrates sinus rhythm, heart rate 91 bpm  TTE IMPRESSIONS May 2021 1. Left ventricular ejection fraction, by estimation, is 60 to 65%. The left ventricle has normal function. The left ventricle has no regional wall motion abnormalities. There is mild left ventricular hypertrophy.  Left ventricular diastolic parameters are indeterminate.  2. Right ventricular systolic function is normal. The right ventricular size is normal. There is normal  pulmonary artery systolic pressure.  3. Left atrial size was severely dilated.  4. The mitral valve is normal in structure. No evidence of mitral valve regurgitation. Mild mitral stenosis.  5. The aortic valve is normal in structure. Aortic valve regurgitation is mild. No aortic stenosis is present.  6. The inferior vena cava is normal in size with greater than 50% respiratory variability, suggesting right atrial pressure of 3 mmHg.   Recent Labs: No results found for requested labs within last 8760 hours.  Recent Lipid Panel No results found for: CHOL, TRIG, HDL, CHOLHDL, VLDL, LDLCALC,  LDLDIRECT  Physical Exam:    VS:  BP 108/60   Pulse 91   Ht '5\' 9"'$  (1.753 m)   Wt 176 lb (79.8 kg)   BMI 25.99 kg/m     Wt Readings from Last 3 Encounters:  07/18/20 176 lb (79.8 kg)  01/20/20 171 lb 6.4 oz (77.7 kg)  07/20/19 179 lb (81.2 kg)     GEN: Well nourished, well developed in no acute distress HEENT: Normal NECK: No JVD; No carotid bruits LYMPHATICS: No lymphadenopathy CARDIAC: S1S2 noted,RRR, no murmurs, rubs, gallops RESPIRATORY:  Clear to auscultation without rales, wheezing or rhonchi  ABDOMEN: Soft, non-tender, non-distended, +bowel sounds, no guarding. EXTREMITIES: No edema, No cyanosis, no clubbing MUSCULOSKELETAL:  No deformity  SKIN: Warm and dry NEUROLOGIC:  Alert and oriented x 3, non-focal PSYCHIATRIC:  Normal affect, good insight  ASSESSMENT:    1. PAF (paroxysmal atrial fibrillation) ([place])   2. Nonischemic cardiomyopathy ([place])   3. Mild mitral stenosis   4. ESRD (end-stage renal disease) due to SLE ([place])   5. Dependence on renal dialysis [facility])    PLAN:     She is in sinus rhythm today.  We will keep the patient on amiodarone as well as her Eliquis.  No signs of any bleeding. She is on hemodialysis for her end-stage acute renal disease.  Due to hypotension she has been able to tolerate midodrine 10 mg 3 times a week post HD. Blood pressure deceptively in the office today. We did fill out her Medicaid transportation form for her visits. I informed the patient that I will be transitioning to our [employer] office and she is planning on having her visits there as well.  The patient is in agreement with the above plan. The patient left the office in stable condition.  The patient will follow up in 1 year or sooner if needed.   Medication Adjustments/Labs and Tests Ordered: Current medicines are reviewed at length with the patient today.  Concerns regarding medicines are outlined above.  No orders of the defined types were placed in this  encounter.  No orders of the defined types were placed in this encounter.   Patient Instructions  Medication Instructions:  Your physician recommends that you continue on your current medications as directed. Please refer to the Current Medication list given to you today. Refilled Midodrine *If you need a refill on your cardiac medications before your next appointment, please call your pharmacy*   Lab Work: None If you have labs (blood work) drawn today and your tests are completely normal, you will receive your results only by: Marland Kitchen MyChart Message (if you have MyChart) OR . A paper copy in the mail If you have any lab test that is abnormal or we need to change your treatment, we will call you to review the results.   Testing/Procedures: None   Follow-Up: At [facility], you and  your health needs are our priority.  As part of our continuing mission to provide you with exceptional heart care, we have created designated Provider Care Teams.  These Care Teams include your primary Cardiologist (physician) and Advanced Practice Providers (APPs -  Physician Assistants and Nurse Practitioners) who all work together to provide you with the care you need, when you need it.  We recommend signing up for the patient portal called "MyChart".  Sign up information is provided on this After Visit Summary.  MyChart is used to connect with patients for Virtual Visits (Telemedicine).  Patients are able to view lab/test results, encounter notes, upcoming appointments, etc.  Non-urgent messages can be sent to your provider as well.   To learn more about what you can do with MyChart, go to [email or web address].    Your next appointment:   1 year(s)  The format for your next appointment:   In Person  Provider:   Berniece Salines, DO   Other Instructions      Adopting a Healthy Lifestyle.  Know what a healthy weight is for you (roughly BMI <25) and aim to maintain this   Aim for 7+ servings  of fruits and vegetables daily   65-80+ fluid ounces of water or unsweet tea for healthy kidneys   Limit to max 1 drink of alcohol per day; avoid smoking/tobacco   Limit animal fats in diet for cholesterol and heart health - choose grass fed whenever available   Avoid highly processed foods, and foods high in saturated/trans fats   Aim for low stress - take time to unwind and care for your mental health   Aim for 150 min of moderate intensity exercise weekly for heart health, and weights twice weekly for bone health   Aim for 7-9 hours of sleep daily   When it comes to diets, agreement about the perfect plan isnt easy to find, even among the experts. Experts at the [employer] developed an idea known as the Healthy Eating Plate. Just imagine a plate divided into logical, healthy portions.   The emphasis is on diet quality:   Load up on vegetables and fruits - one-half of your plate: Aim for color and variety, and remember that potatoes dont count.   Go for whole grains - one-quarter of your plate: Whole wheat, barley, wheat berries, quinoa, oats, brown rice, and foods made with them. If you want pasta, go with whole wheat pasta.   Protein power - one-quarter of your plate: Fish, chicken, beans, and nuts are all healthy, versatile protein sources. Limit red meat.   The diet, however, does go beyond the plate, offering a few other suggestions.   Use healthy plant oils, such as olive, canola, soy, corn, sunflower and peanut. Check the labels, and avoid partially hydrogenated oil, which have unhealthy trans fats.   If youre thirsty, drink water. Coffee and tea are good in moderation, but skip sugary drinks and limit milk and dairy products to one or two daily servings.   The type of carbohydrate in the diet is more important than the amount. Some sources of carbohydrates, such as vegetables, fruits, whole grains, and beans-are healthier than others.   Finally,  stay active  Signed, Berniece Salines, DO  07/18/2020 9:32 AM    [place]

## 2020-07-18 NOTE — Patient Instructions (Signed)
Medication Instructions:  Your physician recommends that you continue on your current medications as directed. Please refer to the Current Medication list given to you today. Refilled Midodrine *If you need a refill on your cardiac medications before your next appointment, please call your pharmacy*   Lab Work: None If you have labs (blood work) drawn today and your tests are completely normal, you will receive your results only by: Marland Kitchen MyChart Message (if you have MyChart) OR . A paper copy in the mail If you have any lab test that is abnormal or we need to change your treatment, we will call you to review the results.   Testing/Procedures: None   Follow-Up: At [facility], you and your health needs are our priority.  As part of our continuing mission to provide you with exceptional heart care, we have created designated Provider Care Teams.  These Care Teams include your primary Cardiologist (physician) and Advanced Practice Providers (APPs -  Physician Assistants and Nurse Practitioners) who all work together to provide you with the care you need, when you need it.  We recommend signing up for the patient portal called "MyChart".  Sign up information is provided on this After Visit Summary.  MyChart is used to connect with patients for Virtual Visits (Telemedicine).  Patients are able to view lab/test results, encounter notes, upcoming appointments, etc.  Non-urgent messages can be sent to your provider as well.   To learn more about what you can do with MyChart, go to [email or web address].    Your next appointment:   1 year(s)  The format for your next appointment:   In Person  Provider:   Berniece Salines, DO   Other Instructions

## 2020-07-23 ENCOUNTER — Other Ambulatory Visit: Payer: Self-pay | Admitting: Cardiology

## 2020-07-24 NOTE — Telephone Encounter (Signed)
12f 79.8kg, Creatinine, Serum 11.610 mg 04/24/2019, lovw/tobb 07/18/20

## 2020-07-25 ENCOUNTER — Telehealth: Payer: Self-pay

## 2020-07-25 NOTE — Telephone Encounter (Signed)
Pre-op covering staff, can you please get clarification on exactly what patient is going to be having done? How many extractions will she need?  Thank you!

## 2020-07-25 NOTE — Telephone Encounter (Signed)
Left message for dental office to call the office with clarification of procedure to be done. Will need to know how many teeth to be extracted.

## 2020-07-25 NOTE — Telephone Encounter (Signed)
Dr. Leonides Schanz office with [facility] called back with clarification.   ADDENDUM: PROCEDURE AT THIS TIME IS DEEP CLEANING AND EXAM; IF ANY FURTHER DENTAL WORK TO BE DONE DDS OFFICE WILL FAX OVER NEW CLEARANCE  ANESTHESIA: LOCAL

## 2020-07-25 NOTE — Telephone Encounter (Signed)
   Patient Name: Morgan Pope  DOB: 19-Dec-1970  MRN: [id number]   Primary Cardiologist: Berniece Salines, DO  Chart reviewed as part of pre-operative protocol coverage. Simple dental extractions are considered low risk procedures per guidelines and generally do not require any specific cardiac clearance. It is also generally accepted that for simple extractions and dental cleanings, there is no need to interrupt blood thinner therapy.  SBE prophylaxis is not required for the patient from a cardiac standpoint.  I will route this recommendation to the requesting party via Epic fax function and remove from pre-op pool.  Please call with questions.  Darreld Mclean, PA-C 07/25/2020, 4:20 PM

## 2020-07-25 NOTE — Telephone Encounter (Signed)
   [facility] Pre-operative Risk Assessment    Patient Name: Morgan Pope  DOB: 1970/03/22  MRN: [id number]   HEARTCARE STAFF: - Please ensure there is not already an duplicate clearance open for this procedure. - Under Visit Info/Reason for Call, type in Other and utilize the format Clearance MM/DD/YY or Clearance TBD. Do not use dashes or single digits. - If request is for dental extraction, please clarify the # of teeth to be extracted.  Request for surgical clearance:  1. What type of surgery is being performed? Cleaning (simple or deep) , fillings, extractions (simple or surgical)   2. When is this surgery scheduled? TBD   3. What type of clearance is required (medical clearance vs. Pharmacy clearance to hold med vs. Both)? BOoth  4. Are there any medications that need to be held prior to surgery and how long? Eliquis   5. Practice name and name of physician performing surgery? [facility]; Dr. Leonides Schanz   6. What is the office phone number? [phone]   6.   What is the office fax number? [phone]  8.   Anesthesia type (None, local, MAC, general) ? Local with epinephrine   Orvan July 07/25/2020, 10:49 AM  _________________________________________________________________   (provider comments below)

## 2020-09-22 ENCOUNTER — Other Ambulatory Visit: Payer: Self-pay | Admitting: *Deleted

## 2020-09-22 DIAGNOSIS — N186 End stage renal disease: Secondary | ICD-10-CM

## 2020-09-26 ENCOUNTER — Ambulatory Visit (INDEPENDENT_AMBULATORY_CARE_PROVIDER_SITE_OTHER): Payer: Medicare Other | Admitting: Vascular Surgery

## 2020-09-26 ENCOUNTER — Encounter: Payer: Self-pay | Admitting: Vascular Surgery

## 2020-09-26 ENCOUNTER — Other Ambulatory Visit: Payer: Self-pay

## 2020-09-26 ENCOUNTER — Ambulatory Visit (HOSPITAL_COMMUNITY)
Admission: RE | Admit: 2020-09-26 | Discharge: 2020-09-26 | Disposition: A | Discharge status: Home / Self Care | Payer: Medicare Other | Source: Ambulatory Visit | Attending: Vascular Surgery | Admitting: Vascular Surgery

## 2020-09-26 VITALS — BP 101/67 | Temp 97.9°F | Resp 14 | Ht 69.0 in | Wt 175.2 lb

## 2020-09-26 DIAGNOSIS — N186 End stage renal disease: Secondary | ICD-10-CM | POA: Insufficient documentation

## 2020-09-26 DIAGNOSIS — T82898A Other specified complication of vascular prosthetic devices, implants and grafts, initial encounter: Secondary | ICD-10-CM | POA: Diagnosis not present

## 2020-09-26 NOTE — Progress Notes (Signed)
[facility] OF [place]  ASSESSMENT / PLAN: 50 y.o. female with ESRD dialyzing MWF via LUE brachiobasilic AVF. She has steal syndrome with left second digit ulceration.  I reviewed the options for treatment in detail with the patient.  The simplest would be ligation of the fistula, but this is been working well for her and she has failed multiple access surgeries in the past.  If she can she would like to save her fistula.  I think this is reasonable.  We will plan to do a left upper extremity angiogram with and without compression of the fistula to identify bypass targets for possible DRIL.  We will plan to get vein mapping day of angiogram if she has targets amenable to bypass.  CHIEF COMPLAINT: right second digit ulceration in setting of dialysis access  HISTORY OF PRESENT ILLNESS: Morgan Pope is a 50 y.o. female with long history of end-stage renal disease dialyzing Monday, Wednesday, and Friday via left upper extremity brachiobasilic AV fistula.  She has undergone multiple access surgeries in the past.  This fistula has been working for her for some time.  She is started developing left index finger pain several weeks ago.  She developed an ulcer at the tip of her finger which has progressed into a darkened spot.  It is incredibly painful to her.  Reviewed the natural history of dialysis access, steal syndrome, and options for treatment at great length.  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/2019   AV fistula occlusion, initial encounter ([place]) 04/24/2019   Chronic atrial fibrillation ([place]) 07/14/2014   Cough 05/21/2019   Cutaneous abscess, unspecified 04/23/2019   Dependence on renal dialysis ([place]) 01/21/2019   Diarrhea, unspecified 01/21/2019   Discoid lupus erythematosus 01/21/2019   Disorder of phosphorus  metabolism, unspecified 01/21/2019   Dyspnea, unspecified 01/21/2019   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   Encounter for therapeutic drug level monitoring 04/26/2019   End stage renal disease ([place]) 01/21/2019   ESRD (end stage renal disease) ([place]) 01/21/2019   Hyperkalemia 01/25/2019   Hypertension 04/04/2010   Hypertensive chronic kidney disease with stage 5 chronic kidney disease or end stage renal disease ([place]) 01/21/2019   Infection and inflammatory reaction due to other cardiac and vascular devices, implants and grafts, initial encounter ([place]) 04/23/2019   Iron deficiency anemia, unspecified 01/21/2019   Mitral valve stenosis 04/13/2019   Nonischemic cardiomyopathy ([place]) 04/14/2015   EF 28%.  Followed by [employer].   EF improved to 50% with therapy.   Osteopenia 12/30/2011   On bone density 9/13   Other specified complication of vascular prosthetic devices, implants and grafts, initial encounter ([place]) 04/23/2019   Pain in left arm 04/23/2019   Pain, unspecified 01/21/2019   Personal history of COVID-19 06/07/2019   Pruritus, unspecified 01/21/2019   Renal osteodystrophy 01/21/2019   Secondary hyperparathyroidism of renal origin ([place]) 01/21/2019   SLE glomerulonephritis syndrome, WHO class VI ([place]) 04/13/2019   Unspecified disturbances of smell and taste 05/21/2019   Vitamin D deficiency, unspecified 01/21/2019    Past Surgical History:  Procedure Laterality Date   ABDOMINAL HYSTERECTOMY     CARDIAC SURGERY     Released fluid from heart   THYROIDECTOMY      Family History  Problem Relation Age of Onset   Congestive Heart Failure Mother    Diabetes Mother  Renal Disease Mother    Dementia Mother    Diabetes Sister    Renal Disease Sister    Heart attack Brother     Social History   Socioeconomic History   Marital status: Divorced    Spouse name: Not on file   Number of children: Not on file   Years of education: Not on file   Highest education level: Not on file  Occupational  History   Not on file  Tobacco Use   Smoking status: Every Day    Pack years: 0.00    Types: Cigarettes   Smokeless tobacco: Never   Tobacco comments:    TRYING TO STOP  Substance and Sexual Activity   Alcohol use: Not Currently   Drug use: Never   Sexual activity: Not on file  Other Topics Concern   Not on file  Social History Narrative   Not on file   Social Determinants of Health   Financial Resource Strain: Not on file  Food Insecurity: Not on file  Transportation Needs: Not on file  Physical Activity: Not on file  Stress: Not on file  Social Connections: Not on file  Intimate Partner Violence: Not on file    Allergies  Allergen Reactions   Vancomycin Itching   Codeine Rash    Other reaction(s): Unknown Itchy rash    Penicillins     Other reaction(s): Unknown   Fentanyl Nausea And Vomiting   Phoslo [Calcium Acetate (Phos Binder)]     GI upset, does tolerate calcium acetate tab - Eliphos    Current Outpatient Medications  Medication Sig Dispense Refill   amiodarone (PACERONE) 200 MG tablet Take 200 mg by mouth 2 (two) times daily.     calcitRIOL (ROCALTROL) 0.25 MCG capsule Take 0.25 mcg by mouth daily.      Calcium Acetate 667 MG TABS Take 2,001 mg by mouth 3 (three) times daily with meals. And take '1334mg'$  with snacks     Cholecalciferol 50 MCG (2000 UT) TABS Take 2,000 Units by mouth daily.      ELIQUIS 5 MG TABS tablet TAKE 1 TABLET(5 MG) BY MOUTH TWICE DAILY 180 tablet 1   fluticasone (FLONASE) 50 MCG/ACT nasal spray Place 2 sprays into both nostrils 2 (two) times daily.     loratadine (CLARITIN REDITABS) 10 MG dissolvable tablet Take 10 mg by mouth daily.     midodrine (PROAMATINE) 10 MG tablet Take 1 tablet (10 mg total) by mouth 3 (three) times a week. Monday, Wednesday, Friday 40 tablet 3   No current facility-administered medications for this visit.    REVIEW OF SYSTEMS:  '[X]'$  denotes positive finding, '[ ]'$  denotes negative finding Cardiac   Comments:  Chest pain or chest pressure:    Shortness of breath upon exertion:    Short of breath when lying flat:    Irregular heart rhythm:        Vascular    Pain in calf, thigh, or hip brought on by ambulation:    Pain in feet at night that wakes you up from your sleep:     Blood clot in your veins:    Leg swelling:         Pulmonary    Oxygen at home:    Productive cough:     Wheezing:         Neurologic    Sudden weakness in arms or legs:     Sudden numbness in arms or legs:  Sudden onset of difficulty speaking or slurred speech:    Temporary loss of vision in one eye:     Problems with dizziness:         Gastrointestinal    Blood in stool:     Vomited blood:         Genitourinary    Burning when urinating:     Blood in urine:        Psychiatric    Major depression:         Hematologic    Bleeding problems:    Problems with blood clotting too easily:        Skin    Rashes or ulcers:        Constitutional    Fever or chills:      PHYSICAL EXAM Vitals:   09/26/20 1111  BP: 101/67  Resp: 14  Temp: 97.9 F (36.6 C)  TempSrc: Temporal  Weight: 175 lb 3.2 oz (79.5 kg)  Height: '5\' 9"'$  (1.753 m)    Constitutional: well appearing. no distress. Appears well nourished.  Neurologic: CN intact. no focal findings. no sensory loss. Psychiatric:  Mood and affect symmetric and appropriate. Eyes:  No icterus. No conjunctival pallor. Ears, nose, throat:  mucous membranes moist. Midline trachea.  Cardiac: regular rate and rhythm.  Respiratory:  unlabored. Abdominal:  soft, non-tender, non-distended.  Peripheral vascular: multiple scars across bilateral forearms consistent with prior access surgery. Left brachiobasilic AVF with good thrill. Extremity: no edema. no cyanosis. no pallor.  Skin: no gangrene. no ulceration.  Lymphatic: no Stemmer's sign. no palpable lymphadenopathy.  PERTINENT LABORATORY AND RADIOLOGIC DATA  Most recent CBC CBC Latest Ref Rng &  Units 04/25/2019 04/24/2019 04/23/2019  WBC 4.0 - 10.5 K/uL 3.4(L) 4.0 4.7  Hemoglobin 12.0 - 15.0 g/dL 12.4 12.5 13.3  Hematocrit 36.0 - 46.0 % 39.1 40.0 44.3  Platelets 150 - 400 K/uL 81(L) 102(L) 113(L)     Most recent CMP CMP Latest Ref Rng & Units 04/24/2019 04/23/2019  Glucose 70 - 99 mg/dL 82 81  BUN 6 - 20 mg/dL 53(H) 51(H)  Creatinine 0.44 - 1.00 mg/dL 11.61(H) 11.42(H)  Sodium 135 - 145 mmol/L 136 135  Potassium 3.5 - 5.1 mmol/L 5.0 5.7(H)  Chloride 98 - 111 mmol/L 96(L) 93(L)  CO2 22 - 32 mmol/L 24 24  Calcium 8.9 - 10.3 mg/dL 8.5(L) 8.7(L)  Total Protein 6.5 - 8.1 g/dL - 7.1  Total Bilirubin 0.3 - 1.2 mg/dL - 0.9  Alkaline Phos 38 - 126 U/L - 62  AST 15 - 41 U/L - 12(L)  ALT 0 - 44 U/L - 7   Finger PPG 09/26/20 shows flat waveforms.  [name]. Stanford Breed, MD [facility] Phone Number: [phone] 09/26/2020 1:09 PM

## 2020-09-26 NOTE — H&P (View-Only) (Signed)
[facility] OF [place]  ASSESSMENT / PLAN: 50 y.o. female with ESRD dialyzing MWF via LUE brachiobasilic AVF. She has steal syndrome with left second digit ulceration.  I reviewed the options for treatment in detail with the patient.  The simplest would be ligation of the fistula, but this is been working well for her and she has failed multiple access surgeries in the past.  If she can she would like to save her fistula.  I think this is reasonable.  We will plan to do a left upper extremity angiogram with and without compression of the fistula to identify bypass targets for possible DRIL.  We will plan to get vein mapping day of angiogram if she has targets amenable to bypass.  CHIEF COMPLAINT: right second digit ulceration in setting of dialysis access  HISTORY OF PRESENT ILLNESS: Morgan Pope is a 50 y.o. female with long history of end-stage renal disease dialyzing Monday, Wednesday, and Friday via left upper extremity brachiobasilic AV fistula.  She has undergone multiple access surgeries in the past.  This fistula has been working for her for some time.  She is started developing left index finger pain several weeks ago.  She developed an ulcer at the tip of her finger which has progressed into a darkened spot.  It is incredibly painful to her.  Reviewed the natural history of dialysis access, steal syndrome, and options for treatment at great length.  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/2019   AV fistula occlusion, initial encounter ([place]) 04/24/2019   Chronic atrial fibrillation ([place]) 07/14/2014   Cough 05/21/2019   Cutaneous abscess, unspecified 04/23/2019   Dependence on renal dialysis ([place]) 01/21/2019   Diarrhea, unspecified 01/21/2019   Discoid lupus erythematosus 01/21/2019   Disorder of phosphorus  metabolism, unspecified 01/21/2019   Dyspnea, unspecified 01/21/2019   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   Encounter for therapeutic drug level monitoring 04/26/2019   End stage renal disease ([place]) 01/21/2019   ESRD (end stage renal disease) ([place]) 01/21/2019   Hyperkalemia 01/25/2019   Hypertension 04/04/2010   Hypertensive chronic kidney disease with stage 5 chronic kidney disease or end stage renal disease ([place]) 01/21/2019   Infection and inflammatory reaction due to other cardiac and vascular devices, implants and grafts, initial encounter ([place]) 04/23/2019   Iron deficiency anemia, unspecified 01/21/2019   Mitral valve stenosis 04/13/2019   Nonischemic cardiomyopathy ([place]) 04/14/2015   EF 28%.  Followed by [employer].   EF improved to 50% with therapy.   Osteopenia 12/30/2011   On bone density 9/13   Other specified complication of vascular prosthetic devices, implants and grafts, initial encounter ([place]) 04/23/2019   Pain in left arm 04/23/2019   Pain, unspecified 01/21/2019   Personal history of COVID-19 06/07/2019   Pruritus, unspecified 01/21/2019   Renal osteodystrophy 01/21/2019   Secondary hyperparathyroidism of renal origin ([place]) 01/21/2019   SLE glomerulonephritis syndrome, WHO class VI ([place]) 04/13/2019   Unspecified disturbances of smell and taste 05/21/2019   Vitamin D deficiency, unspecified 01/21/2019    Past Surgical History:  Procedure Laterality Date   ABDOMINAL HYSTERECTOMY     CARDIAC SURGERY     Released fluid from heart   THYROIDECTOMY      Family History  Problem Relation Age of Onset   Congestive Heart Failure Mother    Diabetes Mother  Renal Disease Mother    Dementia Mother    Diabetes Sister    Renal Disease Sister    Heart attack Brother     Social History   Socioeconomic History   Marital status: Divorced    Spouse name: Not on file   Number of children: Not on file   Years of education: Not on file   Highest education level: Not on file  Occupational  History   Not on file  Tobacco Use   Smoking status: Every Day    Pack years: 0.00    Types: Cigarettes   Smokeless tobacco: Never   Tobacco comments:    TRYING TO STOP  Substance and Sexual Activity   Alcohol use: Not Currently   Drug use: Never   Sexual activity: Not on file  Other Topics Concern   Not on file  Social History Narrative   Not on file   Social Determinants of Health   Financial Resource Strain: Not on file  Food Insecurity: Not on file  Transportation Needs: Not on file  Physical Activity: Not on file  Stress: Not on file  Social Connections: Not on file  Intimate Partner Violence: Not on file    Allergies  Allergen Reactions   Vancomycin Itching   Codeine Rash    Other reaction(s): Unknown Itchy rash    Penicillins     Other reaction(s): Unknown   Fentanyl Nausea And Vomiting   Phoslo [Calcium Acetate (Phos Binder)]     GI upset, does tolerate calcium acetate tab - Eliphos    Current Outpatient Medications  Medication Sig Dispense Refill   amiodarone (PACERONE) 200 MG tablet Take 200 mg by mouth 2 (two) times daily.     calcitRIOL (ROCALTROL) 0.25 MCG capsule Take 0.25 mcg by mouth daily.      Calcium Acetate 667 MG TABS Take 2,001 mg by mouth 3 (three) times daily with meals. And take '1334mg'$  with snacks     Cholecalciferol 50 MCG (2000 UT) TABS Take 2,000 Units by mouth daily.      ELIQUIS 5 MG TABS tablet TAKE 1 TABLET(5 MG) BY MOUTH TWICE DAILY 180 tablet 1   fluticasone (FLONASE) 50 MCG/ACT nasal spray Place 2 sprays into both nostrils 2 (two) times daily.     loratadine (CLARITIN REDITABS) 10 MG dissolvable tablet Take 10 mg by mouth daily.     midodrine (PROAMATINE) 10 MG tablet Take 1 tablet (10 mg total) by mouth 3 (three) times a week. Monday, Wednesday, Friday 40 tablet 3   No current facility-administered medications for this visit.    REVIEW OF SYSTEMS:  '[X]'$  denotes positive finding, '[ ]'$  denotes negative finding Cardiac   Comments:  Chest pain or chest pressure:    Shortness of breath upon exertion:    Short of breath when lying flat:    Irregular heart rhythm:        Vascular    Pain in calf, thigh, or hip brought on by ambulation:    Pain in feet at night that wakes you up from your sleep:     Blood clot in your veins:    Leg swelling:         Pulmonary    Oxygen at home:    Productive cough:     Wheezing:         Neurologic    Sudden weakness in arms or legs:     Sudden numbness in arms or legs:  Sudden onset of difficulty speaking or slurred speech:    Temporary loss of vision in one eye:     Problems with dizziness:         Gastrointestinal    Blood in stool:     Vomited blood:         Genitourinary    Burning when urinating:     Blood in urine:        Psychiatric    Major depression:         Hematologic    Bleeding problems:    Problems with blood clotting too easily:        Skin    Rashes or ulcers:        Constitutional    Fever or chills:      PHYSICAL EXAM Vitals:   09/26/20 1111  BP: 101/67  Resp: 14  Temp: 97.9 F (36.6 C)  TempSrc: Temporal  Weight: 175 lb 3.2 oz (79.5 kg)  Height: '5\' 9"'$  (1.753 m)    Constitutional: well appearing. no distress. Appears well nourished.  Neurologic: CN intact. no focal findings. no sensory loss. Psychiatric:  Mood and affect symmetric and appropriate. Eyes:  No icterus. No conjunctival pallor. Ears, nose, throat:  mucous membranes moist. Midline trachea.  Cardiac: regular rate and rhythm.  Respiratory:  unlabored. Abdominal:  soft, non-tender, non-distended.  Peripheral vascular: multiple scars across bilateral forearms consistent with prior access surgery. Left brachiobasilic AVF with good thrill. Extremity: no edema. no cyanosis. no pallor.  Skin: no gangrene. no ulceration.  Lymphatic: no Stemmer's sign. no palpable lymphadenopathy.  PERTINENT LABORATORY AND RADIOLOGIC DATA  Most recent CBC CBC Latest Ref Rng &  Units 04/25/2019 04/24/2019 04/23/2019  WBC 4.0 - 10.5 K/uL 3.4(L) 4.0 4.7  Hemoglobin 12.0 - 15.0 g/dL 12.4 12.5 13.3  Hematocrit 36.0 - 46.0 % 39.1 40.0 44.3  Platelets 150 - 400 K/uL 81(L) 102(L) 113(L)     Most recent CMP CMP Latest Ref Rng & Units 04/24/2019 04/23/2019  Glucose 70 - 99 mg/dL 82 81  BUN 6 - 20 mg/dL 53(H) 51(H)  Creatinine 0.44 - 1.00 mg/dL 11.61(H) 11.42(H)  Sodium 135 - 145 mmol/L 136 135  Potassium 3.5 - 5.1 mmol/L 5.0 5.7(H)  Chloride 98 - 111 mmol/L 96(L) 93(L)  CO2 22 - 32 mmol/L 24 24  Calcium 8.9 - 10.3 mg/dL 8.5(L) 8.7(L)  Total Protein 6.5 - 8.1 g/dL - 7.1  Total Bilirubin 0.3 - 1.2 mg/dL - 0.9  Alkaline Phos 38 - 126 U/L - 62  AST 15 - 41 U/L - 12(L)  ALT 0 - 44 U/L - 7   Finger PPG 09/26/20 shows flat waveforms.  [name]. Stanford Breed, MD [facility] Phone Number: [phone] 09/26/2020 1:09 PM

## 2020-09-27 ENCOUNTER — Other Ambulatory Visit: Payer: Self-pay | Admitting: Physician Assistant

## 2020-09-27 ENCOUNTER — Other Ambulatory Visit: Payer: Self-pay

## 2020-09-27 ENCOUNTER — Encounter: Payer: Medicare Other | Admitting: Vascular Surgery

## 2020-09-27 ENCOUNTER — Other Ambulatory Visit (HOSPITAL_COMMUNITY): Payer: Medicare Other

## 2020-09-27 MED ORDER — HYDROCODONE-ACETAMINOPHEN 5-325 MG PO TABS: 1.0000 | ORAL_TABLET | ORAL | 0 refills | Status: AC | PRN

## 2020-10-04 ENCOUNTER — Encounter (HOSPITAL_COMMUNITY)
Admission: RE | Disposition: A | Discharge status: Home / Self Care | Payer: Self-pay | Source: Home / Self Care | Attending: Vascular Surgery

## 2020-10-04 ENCOUNTER — Encounter (HOSPITAL_COMMUNITY): Payer: Medicare Other

## 2020-10-04 ENCOUNTER — Other Ambulatory Visit: Payer: Self-pay

## 2020-10-04 ENCOUNTER — Ambulatory Visit (HOSPITAL_COMMUNITY): Admission: RE | Admit: 2020-10-04 | Payer: Medicare Other | Source: Ambulatory Visit

## 2020-10-04 ENCOUNTER — Ambulatory Visit (HOSPITAL_COMMUNITY)
Admission: RE | Admit: 2020-10-04 | Discharge: 2020-10-04 | Disposition: A | Discharge status: Home / Self Care | Payer: Medicare Other | Attending: Vascular Surgery | Admitting: Vascular Surgery

## 2020-10-04 DIAGNOSIS — Y841 Kidney dialysis as the cause of abnormal reaction of the patient, or of later complication, without mention of misadventure at the time of the procedure: Secondary | ICD-10-CM | POA: Insufficient documentation

## 2020-10-04 DIAGNOSIS — F17210 Nicotine dependence, cigarettes, uncomplicated: Secondary | ICD-10-CM | POA: Insufficient documentation

## 2020-10-04 DIAGNOSIS — Z992 Dependence on renal dialysis: Secondary | ICD-10-CM | POA: Diagnosis not present

## 2020-10-04 DIAGNOSIS — M79642 Pain in left hand: Secondary | ICD-10-CM | POA: Diagnosis not present

## 2020-10-04 DIAGNOSIS — N186 End stage renal disease: Secondary | ICD-10-CM | POA: Insufficient documentation

## 2020-10-04 DIAGNOSIS — Z880 Allergy status to penicillin: Secondary | ICD-10-CM | POA: Insufficient documentation

## 2020-10-04 DIAGNOSIS — Z885 Allergy status to narcotic agent status: Secondary | ICD-10-CM | POA: Insufficient documentation

## 2020-10-04 DIAGNOSIS — I120 Hypertensive chronic kidney disease with stage 5 chronic kidney disease or end stage renal disease: Secondary | ICD-10-CM | POA: Diagnosis present

## 2020-10-04 DIAGNOSIS — Z79899 Other long term (current) drug therapy: Secondary | ICD-10-CM | POA: Diagnosis not present

## 2020-10-04 DIAGNOSIS — Z881 Allergy status to other antibiotic agents status: Secondary | ICD-10-CM | POA: Insufficient documentation

## 2020-10-04 DIAGNOSIS — L98499 Non-pressure chronic ulcer of skin of other sites with unspecified severity: Secondary | ICD-10-CM | POA: Diagnosis not present

## 2020-10-04 DIAGNOSIS — Y832 Surgical operation with anastomosis, bypass or graft as the cause of abnormal reaction of the patient, or of later complication, without mention of misadventure at the time of the procedure: Secondary | ICD-10-CM | POA: Diagnosis not present

## 2020-10-04 DIAGNOSIS — T82898A Other specified complication of vascular prosthetic devices, implants and grafts, initial encounter: Secondary | ICD-10-CM

## 2020-10-04 DIAGNOSIS — Z7901 Long term (current) use of anticoagulants: Secondary | ICD-10-CM | POA: Diagnosis not present

## 2020-10-04 HISTORY — PX: AORTIC ARCH ANGIOGRAPHY: CATH118224

## 2020-10-04 HISTORY — PX: UPPER EXTREMITY ANGIOGRAPHY: CATH118270

## 2020-10-04 LAB — POCT I-STAT, CHEM 8
BUN: 63 mg/dL — ABNORMAL HIGH (ref 6–20)
Calcium, Ion: 1.04 mmol/L — ABNORMAL LOW (ref 1.15–1.40)
Chloride: 99 mmol/L (ref 98–111)
Creatinine, Ser: 12.2 mg/dL — ABNORMAL HIGH (ref 0.44–1.00)
Glucose, Bld: 89 mg/dL (ref 70–99)
HCT: 45 % (ref 36.0–46.0)
Hemoglobin: 15.3 g/dL — ABNORMAL HIGH (ref 12.0–15.0)
Potassium: 5.6 mmol/L — ABNORMAL HIGH (ref 3.5–5.1)
Sodium: 138 mmol/L (ref 135–145)
TCO2: 34 mmol/L — ABNORMAL HIGH (ref 22–32)

## 2020-10-04 SURGERY — UPPER EXTREMITY ANGIOGRAPHY
Anesthesia: LOCAL

## 2020-10-04 MED ORDER — SODIUM CHLORIDE 0.9 % WEIGHT BASED INFUSION: 1.0000 mL/kg/h | INTRAVENOUS | Status: DC

## 2020-10-04 MED ORDER — OXYCODONE-ACETAMINOPHEN 5-325 MG PO TABS: 2.0000 | ORAL_TABLET | Freq: Once | ORAL | Status: DC

## 2020-10-04 MED ORDER — LIDOCAINE HCL (PF) 1 % IJ SOLN
INTRAMUSCULAR | Status: DC | PRN
  Administered 2020-10-04: 12 mL

## 2020-10-04 MED ORDER — ONDANSETRON HCL 4 MG/2ML IJ SOLN: 4.0000 mg | Freq: Four times a day (QID) | INTRAMUSCULAR | Status: DC | PRN

## 2020-10-04 MED ORDER — HEPARIN SODIUM (PORCINE) 1000 UNIT/ML IJ SOLN
INTRAMUSCULAR | Status: DC | PRN
  Administered 2020-10-04: 8000 [IU] via INTRAVENOUS

## 2020-10-04 MED ORDER — SODIUM CHLORIDE 0.9% FLUSH: 3.0000 mL | Freq: Two times a day (BID) | INTRAVENOUS | Status: DC

## 2020-10-04 MED ORDER — HEPARIN (PORCINE) IN NACL 1000-0.9 UT/500ML-% IV SOLN
INTRAVENOUS | Status: DC | PRN
  Administered 2020-10-04 (×2): 500 mL

## 2020-10-04 MED ORDER — LIDOCAINE HCL (PF) 1 % IJ SOLN
INTRAMUSCULAR | Status: AC
  Filled 2020-10-04: qty 30

## 2020-10-04 MED ORDER — HYDRALAZINE HCL 20 MG/ML IJ SOLN: 5.0000 mg | INTRAMUSCULAR | Status: DC | PRN

## 2020-10-04 MED ORDER — IODIXANOL 320 MG/ML IV SOLN
INTRAVENOUS | Status: DC | PRN
  Administered 2020-10-04: 90 mL

## 2020-10-04 MED ORDER — LABETALOL HCL 5 MG/ML IV SOLN
10.0000 mg | INTRAVENOUS | Status: DC | PRN
Start: 2020-10-04 — End: 2020-10-04

## 2020-10-04 MED ORDER — SODIUM CHLORIDE 0.9 % IV SOLN: INTRAVENOUS | Status: DC

## 2020-10-04 MED ORDER — ACETAMINOPHEN 325 MG PO TABS: 650.0000 mg | ORAL_TABLET | ORAL | Status: DC | PRN

## 2020-10-04 MED ORDER — HEPARIN SODIUM (PORCINE) 1000 UNIT/ML IJ SOLN
INTRAMUSCULAR | Status: AC
  Filled 2020-10-04: qty 1

## 2020-10-04 MED ORDER — HEPARIN (PORCINE) IN NACL 1000-0.9 UT/500ML-% IV SOLN
INTRAVENOUS | Status: AC
  Filled 2020-10-04: qty 500

## 2020-10-04 MED ORDER — SODIUM CHLORIDE 0.9 % IV SOLN: 250.0000 mL | INTRAVENOUS | Status: DC | PRN

## 2020-10-04 MED ORDER — SODIUM CHLORIDE 0.9% FLUSH: 3.0000 mL | INTRAVENOUS | Status: DC | PRN

## 2020-10-04 SURGICAL SUPPLY — 15 items
CATH ANGIO 5F BER2 100CM (CATHETERS) ×3 IMPLANT
CATH ANGIO 5F PIGTAIL 100CM (CATHETERS) ×3 IMPLANT
CATH NAVICROSS ANGLED 135CM (MICROCATHETER) ×3 IMPLANT
CATH TEMPO AQUA 5F 100CM (CATHETERS) ×3 IMPLANT
DEVICE CLOSURE MYNXGRIP 5F (Vascular Products) ×3 IMPLANT
DEVICE TORQUE H2O (MISCELLANEOUS) ×3 IMPLANT
GUIDEWIRE ANGLED .035X260CM (WIRE) ×3 IMPLANT
KIT MICROPUNCTURE NIT STIFF (SHEATH) ×3 IMPLANT
KIT PV (KITS) ×3 IMPLANT
SHEATH PINNACLE 5F 10CM (SHEATH) ×3 IMPLANT
SHEATH PROBE COVER 6X72 (BAG) ×3 IMPLANT
SYR MEDRAD MARK V 150ML (SYRINGE) ×3 IMPLANT
TRANSDUCER W/STOPCOCK (MISCELLANEOUS) ×3 IMPLANT
TRAY PV CATH (CUSTOM PROCEDURE TRAY) ×3 IMPLANT
WIRE BENTSON .035X145CM (WIRE) ×3 IMPLANT

## 2020-10-04 NOTE — Interval H&P Note (Signed)
History and Physical Interval Note:  10/04/2020 9:59 AM  Morgan Pope  has presented today for surgery, with the diagnosis of instage renal.  The various methods of treatment have been discussed with the patient and family. After consideration of risks, benefits and other options for treatment, the patient has consented to  Procedure(s): UPPER EXTREMITY ANGIOGRAPHY (Left) as a surgical intervention.  The patient's history has been reviewed, patient examined, no change in status, stable for surgery.  I have reviewed the patient's chart and labs.  Questions were answered to the patient's satisfaction.     Cherre Robins

## 2020-10-04 NOTE — Op Note (Signed)
DATE OF SERVICE: 10/04/2020  PATIENT:  Morgan Pope  50 y.o. female  PRE-OPERATIVE DIAGNOSIS:  ESRD, left upper extremity brahciobasilic AVF in place, steal syndrome.  POST-OPERATIVE DIAGNOSIS:  Same  PROCEDURE:   1) US guided right common femoral  access 2) Arch aortogram 3) left upper extremity angiogram with third order cannulation (27m total contrast)  SURGEON:  Surgeon(s) and Role:    * HCherre Robins MD - Primary  ASSISTANT: none  An assistant was required to facilitate exposure and expedite the case.  ANESTHESIA:   local  EBL: min  BLOOD ADMINISTERED:none  DRAINS: none   LOCAL MEDICATIONS USED:  LIDOCAINE   SPECIMEN:  none  COUNTS: confirmed correct.  TOURNIQUET:  none  PATIENT DISPOSITION:  PACU - hemodynamically stable.   Delay start of Pharmacological VTE agent (>24hrs) due to surgical blood loss or risk of bleeding: no  INDICATION FOR PROCEDURE: Morgan Pope 50y.o. female with ESRD dialyzing through Pope left upper extremity brachiobasilic AV fistula.  She has failed several previous access surgeries.  This fistula has been working well for her.  She has been on dialysis for 20+ years.  She now has ulceration of the tip of her left hand and constant pain in the hand.  Her overall picture is consistent with steal syndrome. After careful discussion of risks, benefits, and alternatives the patient was offered left upper extremity angiogram. We specifically discussed access site complications. The patient understood and wished to proceed.  OPERATIVE FINDINGS:  Proximal aorta No flow-limiting stenosis Type I aortic arch Common origin of the innominate and left carotid arteries  Left upper extremity angiogram No flow-limiting stenosis from the axillary artery to the brachial bifurcation Brachiobasilic AV fistula in well with virtually no transit of contrast past the fistula Catheter directed into distal brachial artery past the anastomosis and  angiogram reperformed. Were radial and ulnar arteries visualized near the origin coursing to the mid arm Radial artery is dominant and fills the palmar arch Radial artery appears appropriate for bypass target  DESCRIPTION OF PROCEDURE: After identification of the patient in the pre-operative holding area, the patient was transferred to the operating room. The patient was positioned supine on the operating room table. Anesthesia was induced. The groins was prepped and draped in standard fashion. Pope surgical pause was performed confirming correct patient, procedure, and operative location.  The right groin was anesthetized with subcutaneous injection of 1% lidocaine. Using ultrasound guidance, the right common femoral artery was accessed with micropuncture technique. Fluoroscopy was used to confirm cannulation over the femoral head. Sheathogram was not performed. The 81F sheath was upsized to 27F.   The patient was systemically heparinized.  An 0Cayeywas advanced into the ascending aorta.  Pope pigtail catheter was advanced over the wire into the ascending aorta. Arch aortogram was performed - see above for details.  The left subclavian artery was selected with Pope Berenstein catheter.  The Glidewire was advanced into the brachial artery.  The catheter was advanced into the axillary artery.  Left upper extremity selective angiogram was performed.  See above for details.  All endovascular equipment was then removed.  Pope mynx device was used to close the arteriotomy. Hemostasis was excellent upon completion.  Upon completion of the case instrument and sharps counts were confirmed correct. The patient was transferred to the PACU in good condition. I was present for all portions of the procedure.  PLAN: Reviewed findings with patient.  2 options going forward.  Ligation of fistula with creation of new access or distal revascularization.  Patient desires revascularization.  Will check vein mapping today.   We will schedule for distal revascularization with interval ligation in OR on Pope nondialysis day in the near future.  Morgan Pope. Stanford Breed, MD [facility] Phone Number: [phone] 10/04/2020 10:52 AM

## 2020-10-04 NOTE — Progress Notes (Signed)
The patient was not given the PRN ordered oxycodone due to timing of reconciliation from the pharmacy. The patient stated "I would rather just wait until I get home to take something for my pain."

## 2020-10-05 ENCOUNTER — Encounter (HOSPITAL_COMMUNITY): Payer: Self-pay | Admitting: Vascular Surgery

## 2020-10-18 ENCOUNTER — Ambulatory Visit (HOSPITAL_COMMUNITY): Payer: Medicare Other | Admitting: Physician Assistant

## 2020-10-18 ENCOUNTER — Encounter (HOSPITAL_COMMUNITY): Payer: Self-pay | Admitting: Vascular Surgery

## 2020-10-18 NOTE — Anesthesia Preprocedure Evaluation (Deleted)
Anesthesia Evaluation  Patient identified by MRN, date of birth, ID band Patient awake    Reviewed: Allergy & Precautions, NPO status , Patient's Chart, lab work & pertinent test results  Airway        Dental   Pulmonary neg pulmonary ROS, Current Smoker,           Cardiovascular hypertension, +CHF  + dysrhythmias (on eliquis and amiodarone) Atrial Fibrillation   EKG 07/18/2020: NSR.  Rate 91.  Rightward axis.  Low voltage QRS.  Nonspecific T wave abnormality.  TTE 07/20/2019: 1. Left ventricular ejection fraction, by estimation, is 60 to 65%. The  left ventricle has normal function. The left ventricle has no regional  wall motion abnormalities. There is mild left ventricular hypertrophy.  Left ventricular diastolic parameters  are indeterminate.  2. Right ventricular systolic function is normal. The right ventricular  size is normal. There is normal pulmonary artery systolic pressure.  3. Left atrial size was severely dilated.  4. The mitral valve is normal in structure. No evidence of mitral valve  regurgitation. Mild mitral stenosis.  5. The aortic valve is normal in structure. Aortic valve regurgitation is  mild. No aortic stenosis is present.  6. The inferior vena cava is normal in size with greater than 50%  respiratory variability, suggesting right atrial pressure of 3 mmHg.   Neuro/Psych negative neurological ROS  negative psych ROS   GI/Hepatic negative GI ROS, Neg liver ROS,   Endo/Other  negative endocrine ROSSLE  Renal/GU ESRF and DialysisRenal disease (dialysis MWF via left AVF)  negative genitourinary   Musculoskeletal negative musculoskeletal ROS (+)   Abdominal   Peds  Hematology negative hematology ROS (+)   Anesthesia Other Findings steal syndrome with left second digit ulceration  Reproductive/Obstetrics                            Anesthesia Physical Anesthesia  Plan  ASA: 3  Anesthesia Plan: General   Post-op Pain Management:    Induction: Intravenous  PONV Risk Score and Plan: 2 and Midazolam, Dexamethasone and Ondansetron  Airway Management Planned: Oral ETT  Additional Equipment:   Intra-op Plan:   Post-operative Plan: Extubation in OR  Informed Consent: I have reviewed the patients History and Physical, chart, labs and discussed the procedure including the risks, benefits and alternatives for the proposed anesthesia with the patient or authorized representative who has indicated his/her understanding and acceptance.     Dental advisory given  Plan Discussed with: CRNA  Anesthesia Plan Comments: (Case cancelled by surgeon 2/2 symptomatic improvement )      Anesthesia Quick Evaluation

## 2020-10-18 NOTE — Progress Notes (Signed)
DUE TO COVID-19 ONLY ONE VISITOR IS ALLOWED TO COME WITH YOU AND STAY IN THE WAITING ROOM ONLY DURING PRE OP AND PROCEDURE DAY OF SURGERY.   PCP - Darci Current, NP Cardiologist - Berniece Salines, DO  Chest x-ray - n/a EKG - 07/18/20 Stress Test - n/a ECHO - 07/20/19 Cardiac Cath - n/a  Sleep Study -  n/a CPAP - none  Blood Thinner Instructions:  Last dose of Eliquis was on 10/15/20.  Anesthesia review: Yes  STOP now taking any Aspirin (unless otherwise instructed by your surgeon), Aleve, Naproxen, Ibuprofen, Motrin, Advil, Goody's, BC's, all herbal medications, fish oil, and all vitamins.   Coronavirus Screening Covid test n/a - Amb Surg  Do you have any of the following symptoms:  Cough yes/no: No Fever (>100.22F)  yes/no: No Runny nose yes/no: No Sore throat yes/no: No Difficulty breathing/shortness of breath  yes/no: No  Have you traveled in the last 14 days and where? yes/no: No  Patient verbalized understanding of instructions that were given via phone.

## 2020-10-18 NOTE — Progress Notes (Signed)
Anesthesia Chart Review: Same day workup  Follows with cardiology for history of paroxysmal atrial fibrillation (on Eliquis and amiodarone), nonischemic cardiomyopathy (notes in care everywhere state normal coronaries by cath 2011), most recent ejection fraction in September 2019 was 55%, hypertensive heart disease.  Last seen 07/18/2020, stable at that time, no changes made to management, advised follow-up in 1 year.  History of lupus, ESRD on hemodialysis Monday Wednesday and Fridays.  She has hypotension with dialysis and takes midodrine 10 mg on dialysis days.  Pt will need DOS labs and eval.   EKG 07/18/2020: NSR.  Rate 91.  Rightward axis.  Low voltage QRS.  Nonspecific T wave abnormality.  TTE 07/20/2019:  1. Left ventricular ejection fraction, by estimation, is 60 to 65%. The  left ventricle has normal function. The left ventricle has no regional  wall motion abnormalities. There is mild left ventricular hypertrophy.  Left ventricular diastolic parameters  are indeterminate.   2. Right ventricular systolic function is normal. The right ventricular  size is normal. There is normal pulmonary artery systolic pressure.   3. Left atrial size was severely dilated.   4. The mitral valve is normal in structure. No evidence of mitral valve  regurgitation. Mild mitral stenosis.   5. The aortic valve is normal in structure. Aortic valve regurgitation is  mild. No aortic stenosis is present.   6. The inferior vena cava is normal in size with greater than 50%  respiratory variability, suggesting right atrial pressure of 3 mmHg.    Wynonia Musty [facility] Short Stay Center/Anesthesiology Phone [phone] 10/18/2020 11:40 AM

## 2020-10-19 ENCOUNTER — Encounter (HOSPITAL_COMMUNITY)
Admission: RE | Disposition: A | Discharge status: Home / Self Care | Payer: Self-pay | Source: Home / Self Care | Attending: Vascular Surgery

## 2020-10-19 ENCOUNTER — Ambulatory Visit (HOSPITAL_COMMUNITY)
Admission: RE | Admit: 2020-10-19 | Discharge: 2020-10-19 | Disposition: A | Discharge status: Home / Self Care | Payer: Medicare Other | Attending: Vascular Surgery | Admitting: Vascular Surgery

## 2020-10-19 DIAGNOSIS — I509 Heart failure, unspecified: Secondary | ICD-10-CM | POA: Diagnosis not present

## 2020-10-19 DIAGNOSIS — Z992 Dependence on renal dialysis: Secondary | ICD-10-CM | POA: Insufficient documentation

## 2020-10-19 DIAGNOSIS — Z880 Allergy status to penicillin: Secondary | ICD-10-CM | POA: Diagnosis not present

## 2020-10-19 DIAGNOSIS — I132 Hypertensive heart and chronic kidney disease with heart failure and with stage 5 chronic kidney disease, or end stage renal disease: Secondary | ICD-10-CM | POA: Diagnosis not present

## 2020-10-19 DIAGNOSIS — Z8249 Family history of ischemic heart disease and other diseases of the circulatory system: Secondary | ICD-10-CM | POA: Diagnosis not present

## 2020-10-19 DIAGNOSIS — Z7901 Long term (current) use of anticoagulants: Secondary | ICD-10-CM | POA: Diagnosis not present

## 2020-10-19 DIAGNOSIS — Z8616 Personal history of COVID-19: Secondary | ICD-10-CM | POA: Insufficient documentation

## 2020-10-19 DIAGNOSIS — Z833 Family history of diabetes mellitus: Secondary | ICD-10-CM | POA: Diagnosis not present

## 2020-10-19 DIAGNOSIS — F17210 Nicotine dependence, cigarettes, uncomplicated: Secondary | ICD-10-CM | POA: Diagnosis not present

## 2020-10-19 DIAGNOSIS — E1122 Type 2 diabetes mellitus with diabetic chronic kidney disease: Secondary | ICD-10-CM | POA: Diagnosis present

## 2020-10-19 DIAGNOSIS — N186 End stage renal disease: Secondary | ICD-10-CM | POA: Insufficient documentation

## 2020-10-19 DIAGNOSIS — Z881 Allergy status to other antibiotic agents status: Secondary | ICD-10-CM | POA: Insufficient documentation

## 2020-10-19 DIAGNOSIS — Z885 Allergy status to narcotic agent status: Secondary | ICD-10-CM | POA: Insufficient documentation

## 2020-10-19 DIAGNOSIS — E11622 Type 2 diabetes mellitus with other skin ulcer: Secondary | ICD-10-CM | POA: Diagnosis not present

## 2020-10-19 DIAGNOSIS — Z538 Procedure and treatment not carried out for other reasons: Secondary | ICD-10-CM | POA: Insufficient documentation

## 2020-10-19 DIAGNOSIS — L98499 Non-pressure chronic ulcer of skin of other sites with unspecified severity: Secondary | ICD-10-CM | POA: Diagnosis not present

## 2020-10-19 HISTORY — DX: Heart failure, unspecified: I50.9

## 2020-10-19 SURGERY — DISTAL REVASCULARIZATION AND INTERVAL LIGATION PROCEDURE
Anesthesia: General | Laterality: Left

## 2020-10-19 MED ORDER — SODIUM CHLORIDE 0.9 % IV SOLN: INTRAVENOUS | Status: DC

## 2020-10-19 MED ORDER — CHLORHEXIDINE GLUCONATE 4 % EX LIQD: 60.0000 mL | Freq: Once | CUTANEOUS | Status: DC

## 2020-10-19 MED ORDER — MIDAZOLAM HCL 2 MG/2ML IJ SOLN
INTRAMUSCULAR | Status: AC
  Filled 2020-10-19: qty 2

## 2020-10-19 MED ORDER — PROPOFOL 10 MG/ML IV BOLUS
INTRAVENOUS | Status: AC
  Filled 2020-10-19: qty 20

## 2020-10-19 MED ORDER — FENTANYL CITRATE (PF) 250 MCG/5ML IJ SOLN
INTRAMUSCULAR | Status: AC
  Filled 2020-10-19: qty 5

## 2020-10-19 MED ORDER — CHLORHEXIDINE GLUCONATE 0.12 % MT SOLN
15.0000 mL | Freq: Once | OROMUCOSAL | Status: DC
  Filled 2020-10-19: qty 15

## 2020-10-19 MED ORDER — ORAL CARE MOUTH RINSE: 15.0000 mL | Freq: Once | OROMUCOSAL | Status: DC

## 2020-10-19 MED ORDER — CIPROFLOXACIN IN D5W 400 MG/200ML IV SOLN
400.0000 mg | INTRAVENOUS | Status: DC
  Filled 2020-10-19: qty 200

## 2020-10-19 SURGICAL SUPPLY — 31 items
ARMBAND PINK RESTRICT EXTREMIT (MISCELLANEOUS) ×2 IMPLANT
BENZOIN TINCTURE PRP APPL 2/3 (GAUZE/BANDAGES/DRESSINGS) ×2 IMPLANT
CANISTER SUCT 3000ML PPV (MISCELLANEOUS) ×2 IMPLANT
CANNULA VESSEL 3MM 2 BLNT TIP (CANNULA) ×2 IMPLANT
CHLORAPREP W/TINT 26 (MISCELLANEOUS) ×2 IMPLANT
CLIP VESOCCLUDE MED 6/CT (CLIP) ×2 IMPLANT
CLIP VESOCCLUDE SM WIDE 6/CT (CLIP) ×2 IMPLANT
COVER PROBE W GEL 5X96 (DRAPES) IMPLANT
ELECT REM PT RETURN 9FT ADLT (ELECTROSURGICAL) ×2
ELECTRODE REM PT RTRN 9FT ADLT (ELECTROSURGICAL) ×1 IMPLANT
GLOVE SURG POLYISO LF SZ8 (GLOVE) ×2 IMPLANT
GOWN STRL REUS W/ TWL LRG LVL3 (GOWN DISPOSABLE) ×2 IMPLANT
GOWN STRL REUS W/ TWL XL LVL3 (GOWN DISPOSABLE) ×1 IMPLANT
GOWN STRL REUS W/TWL LRG LVL3 (GOWN DISPOSABLE) ×2
GOWN STRL REUS W/TWL XL LVL3 (GOWN DISPOSABLE) ×1
INSERT FOGARTY SM (MISCELLANEOUS) IMPLANT
KIT BASIN OR (CUSTOM PROCEDURE TRAY) ×2 IMPLANT
KIT TURNOVER KIT B (KITS) ×2 IMPLANT
NEEDLE 18GX1X1/2 (RX/OR ONLY) (NEEDLE) IMPLANT
NS IRRIG 1000ML POUR BTL (IV SOLUTION) ×2 IMPLANT
PACK CV ACCESS (CUSTOM PROCEDURE TRAY) ×2 IMPLANT
PAD ARMBOARD 7.5X6 YLW CONV (MISCELLANEOUS) ×4 IMPLANT
STRIP CLOSURE SKIN 1/2X4 (GAUZE/BANDAGES/DRESSINGS) ×2 IMPLANT
SUT MNCRL AB 4-0 PS2 18 (SUTURE) ×2 IMPLANT
SUT PROLENE 6 0 BV (SUTURE) ×2 IMPLANT
SUT VIC AB 3-0 SH 27 (SUTURE) ×1
SUT VIC AB 3-0 SH 27X BRD (SUTURE) ×1 IMPLANT
SYR 3ML LL SCALE MARK (SYRINGE) IMPLANT
TOWEL GREEN STERILE (TOWEL DISPOSABLE) ×2 IMPLANT
UNDERPAD 30X36 HEAVY ABSORB (UNDERPADS AND DIAPERS) ×2 IMPLANT
WATER STERILE IRR 1000ML POUR (IV SOLUTION) ×2 IMPLANT

## 2020-10-19 NOTE — Progress Notes (Signed)
Dr. Stanford Breed at bedside talking to patient regarding cancelling procedure for today due to improved blood flow. Patient verbalized understanding.

## 2020-10-19 NOTE — H&P (Signed)
[facility] [place]  ASSESSMENT / PLAN: 50 y.o. female with ESRD dialyzing MWF via LUE brachiobasilic AVF. Her left second digit is healing slowly. She feels better globally.   CHIEF COMPLAINT: right second digit ulceration in setting of dialysis access  HISTORY OF PRESENT ILLNESS: Morgan Pope is a 50 y.o. female with long history of end-stage renal disease dialyzing Monday, Wednesday, and Friday via left upper extremity brachiobasilic AV fistula.  She has undergone multiple access surgeries in the past.  This fistula has been working for her for some time.  She is started developing left index finger pain several weeks ago.  She developed an ulcer at the tip of her finger which has progressed into a darkened spot.  It is incredibly painful to her.  Reviewed the natural history of dialysis access, steal syndrome, and options for treatment at great length.   10/19/20: Patient presents to preop for surgery. Her left index finger is healing. Her hand feels globally better. We discussed proceeding vs. Watchful waiting. We both prefer waiting.  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   Disorder of phosphorus metabolism, unspecified 01/21/2019   Dyspnea, unspecified 01/21/2019   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   Encounter for therapeutic drug level monitoring 04/26/2019   End stage renal disease ([place]) 01/21/2019   ESRD (end stage renal disease) ([place])  01/21/2019   Hyperkalemia 01/25/2019   Hypertension 04/04/2010   Hypertensive chronic kidney disease with stage 5 chronic kidney disease or end stage renal disease ([place]) 01/21/2019   Infection and inflammatory reaction due to other cardiac and vascular devices, implants and grafts, initial encounter ([place]) 04/23/2019   Iron deficiency anemia, unspecified 01/21/2019   Mitral valve stenosis 04/13/2019   Nonischemic cardiomyopathy ([place]) 04/14/2015   EF 28%.  Followed by [employer].   EF improved to 50% with therapy.   Osteopenia 12/30/2011   On bone density 9/13   Other specified complication of vascular prosthetic devices, implants and grafts, initial encounter ([place]) 04/23/2019   Pain in left arm 04/23/2019   Pain, unspecified 01/21/2019   Personal history of COVID-19 06/07/2019   Pruritus, unspecified 01/21/2019   Renal osteodystrophy 01/21/2019   Secondary hyperparathyroidism of renal origin ([place]) 01/21/2019   SLE glomerulonephritis syndrome, WHO class VI ([place]) 04/13/2019   Unspecified disturbances of smell and taste 05/21/2019   Vitamin D deficiency, unspecified 01/21/2019    Past Surgical History:  Procedure Laterality Date   ABDOMINAL HYSTERECTOMY     AORTIC ARCH ANGIOGRAPHY N/A 10/04/2020   Procedure: AORTIC ARCH ANGIOGRAPHY;  Surgeon: Cherre Robins, MD;  Location: [place] [facility];  Service: Cardiovascular;  Laterality: N/A;   CARDIAC SURGERY     Released fluid from heart   THYROIDECTOMY     UPPER EXTREMITY ANGIOGRAPHY Left 10/04/2020   Procedure: UPPER EXTREMITY ANGIOGRAPHY;  Surgeon: Cherre Robins, MD;  Location: [place] [facility];  Service: Cardiovascular;  Laterality: Left;    Family History  Problem Relation Age of Onset   Congestive Heart Failure Mother  Diabetes Mother    Renal Disease Mother    Dementia Mother    Diabetes Sister    Renal Disease Sister    Heart attack Brother     Social History   Socioeconomic History   Marital status: Divorced     Spouse name: Not on file   Number of children: Not on file   Years of education: Not on file   Highest education level: Not on file  Occupational History   Not on file  Tobacco Use   Smoking status: Every Day    Types: Cigarettes   Smokeless tobacco: Never   Tobacco comments:    TRYING TO STOP  Substance and Sexual Activity   Alcohol use: Not Currently   Drug use: Never   Sexual activity: Not on file  Other Topics Concern   Not on file  Social History Narrative   Not on file   Social Determinants of Health   Financial Resource Strain: Not on file  Food Insecurity: Not on file  Transportation Needs: Not on file  Physical Activity: Not on file  Stress: Not on file  Social Connections: Not on file  Intimate Partner Violence: Not on file    Allergies  Allergen Reactions   Vancomycin Itching   Codeine Rash    Itchy rash    Penicillins Other (See Comments)    Unsure of reaction   Fentanyl Nausea And Vomiting   Phoslo [Calcium Acetate (Phos Binder)]     GI upset, does tolerate calcium acetate tab - Eliphos    Current Facility-Administered Medications  Medication Dose Route Frequency Provider Last Rate Last Admin   0.9 %  sodium chloride infusion   Intravenous Continuous Cherre Robins, MD       chlorhexidine (HIBICLENS) 4 % liquid 4 application  60 mL Topical Once Cherre Robins, MD       And   [START ON 10/20/2020] chlorhexidine (HIBICLENS) 4 % liquid 4 application  60 mL Topical Once Cherre Robins, MD       chlorhexidine (PERIDEX) 0.12 % solution 15 mL  15 mL Mouth/Throat Once Woodrum, Chelsey L, MD       Or   MEDLINE mouth rinse  15 mL Mouth Rinse Once Woodrum, Chelsey L, MD       ciprofloxacin (CIPRO) IVPB 400 mg  400 mg Intravenous 60 min Pre-Op Cherre Robins, MD       Current Outpatient Medications  Medication Sig Dispense Refill   amiodarone (PACERONE) 200 MG tablet Take 200 mg by mouth 2 (two) times daily.     calcium acetate (PHOSLO) 667 MG capsule  Take 1,334-2,001 mg by mouth See admin instructions. Take 3 capsules (2001 mg) by mouth 3 times daily with meals & take 2 capsules (1334 mg) by mouth with snacks     cholecalciferol (VITAMIN D) 25 MCG (1000 UNIT) tablet Take 1,000 Units by mouth in the morning.     ELIQUIS 5 MG TABS tablet TAKE 1 TABLET(5 MG) BY MOUTH TWICE DAILY (Patient taking differently: Take 5 mg by mouth 2 (two) times daily.) 180 tablet 1   fluticasone (FLONASE) 50 MCG/ACT nasal spray Place 2 sprays into both nostrils 2 (two) times daily as needed for allergies.     gabapentin (NEURONTIN) 100 MG capsule Take 100 mg by mouth at bedtime.     HYDROcodone-acetaminophen (NORCO/VICODIN) 5-325 MG tablet Take 1 tablet by mouth every 4 (four) hours as needed for moderate pain. 20 tablet 0  midodrine (PROAMATINE) 10 MG tablet Take 1 tablet (10 mg total) by mouth 3 (three) times a week. Monday, Wednesday, Friday (Patient taking differently: Take 10 mg by mouth every Monday, Wednesday, and Friday. In the morning) 40 tablet 3   omeprazole (PRILOSEC) 20 MG capsule Take 20-40 mg by mouth in the morning.      REVIEW OF SYSTEMS:  '[X]'$  denotes positive finding, '[ ]'$  denotes negative finding Cardiac  Comments:  Chest pain or chest pressure:    Shortness of breath upon exertion:    Short of breath when lying flat:    Irregular heart rhythm:        Vascular    Pain in calf, thigh, or hip brought on by ambulation:    Pain in feet at night that wakes you up from your sleep:     Blood clot in your veins:    Leg swelling:         Pulmonary    Oxygen at home:    Productive cough:     Wheezing:         Neurologic    Sudden weakness in arms or legs:     Sudden numbness in arms or legs:     Sudden onset of difficulty speaking or slurred speech:    Temporary loss of vision in one eye:     Problems with dizziness:         Gastrointestinal    Blood in stool:     Vomited blood:         Genitourinary    Burning when urinating:     Blood  in urine:        Psychiatric    Major depression:         Hematologic    Bleeding problems:    Problems with blood clotting too easily:        Skin    Rashes or ulcers:        Constitutional    Fever or chills:      PHYSICAL EXAM Vitals:   10/18/20 1648 10/19/20 0859  BP:  97/60  Pulse:  74  Resp:  18  Temp:  98.1 F (36.7 C)  TempSrc:  Oral  SpO2:  100%  Weight: 78.6 kg 78.6 kg  Height: '5\' 9"'$  (1.753 m) '5\' 9"'$  (1.753 m)    Constitutional: well appearing. no distress. Appears well nourished.  Neurologic: CN intact. no focal findings. no sensory loss. Psychiatric:  Mood and affect symmetric and appropriate. Eyes:  No icterus. No conjunctival pallor. Ears, nose, throat:  mucous membranes moist. Midline trachea.  Cardiac: regular rate and rhythm.  Respiratory:  unlabored. Abdominal:  soft, non-tender, non-distended.  Peripheral vascular: multiple scars across bilateral forearms consistent with prior access surgery. Left brachiobasilic AVF with good thrill. Extremity: no edema. no cyanosis. no pallor.  Skin: resolving ulceration of left index finger. Lymphatic: no Stemmer's sign. no palpable lymphadenopathy.  PERTINENT LABORATORY AND RADIOLOGIC DATA  Most recent CBC CBC Latest Ref Rng & Units 10/04/2020 04/25/2019 04/24/2019  WBC 4.0 - 10.5 K/uL - 3.4(L) 4.0  Hemoglobin 12.0 - 15.0 g/dL 15.3(H) 12.4 12.5  Hematocrit 36.0 - 46.0 % 45.0 39.1 40.0  Platelets 150 - 400 K/uL - 81(L) 102(L)     Most recent CMP CMP Latest Ref Rng & Units 10/04/2020 04/24/2019 04/23/2019  Glucose 70 - 99 mg/dL 89 82 81  BUN 6 - 20 mg/dL 63(H) 53(H) 51(H)  Creatinine 0.44 - 1.00 mg/dL 12.20(H) 11.61(H)  11.42(H)  Sodium 135 - 145 mmol/L 138 136 135  Potassium 3.5 - 5.1 mmol/L 5.6(H) 5.0 5.7(H)  Chloride 98 - 111 mmol/L 99 96(L) 93(L)  CO2 22 - 32 mmol/L - 24 24  Calcium 8.9 - 10.3 mg/dL - 8.5(L) 8.7(L)  Total Protein 6.5 - 8.1 g/dL - - 7.1  Total Bilirubin 0.3 - 1.2 mg/dL - - 0.9  Alkaline Phos  38 - 126 U/L - - 62  AST 15 - 41 U/L - - 12(L)  ALT 0 - 44 U/L - - 7   Finger PPG 09/26/20 shows flat waveforms.  Morgan Pope. Stanford Breed, MD [facility] Phone Number: [phone] 10/19/2020 9:34 AM

## 2020-10-20 ENCOUNTER — Encounter (HOSPITAL_COMMUNITY): Payer: Self-pay | Admitting: Vascular Surgery

## 2020-11-02 ENCOUNTER — Other Ambulatory Visit: Payer: Self-pay | Admitting: Student

## 2020-11-02 DIAGNOSIS — Z09 Encounter for follow-up examination after completed treatment for conditions other than malignant neoplasm: Secondary | ICD-10-CM

## 2020-11-03 ENCOUNTER — Other Ambulatory Visit: Payer: Self-pay | Admitting: Student

## 2020-11-03 DIAGNOSIS — N6011 Diffuse cystic mastopathy of right breast: Secondary | ICD-10-CM

## 2020-11-03 DIAGNOSIS — R921 Mammographic calcification found on diagnostic imaging of breast: Secondary | ICD-10-CM

## 2020-12-12 ENCOUNTER — Ambulatory Visit
Admission: RE | Admit: 2020-12-12 | Discharge: 2020-12-12 | Disposition: A | Discharge status: Home / Self Care | Payer: Medicaid Other | Source: Ambulatory Visit | Attending: Student | Admitting: Student

## 2020-12-12 ENCOUNTER — Ambulatory Visit: Admission: RE | Admit: 2020-12-12 | Payer: Medicare Other | Source: Ambulatory Visit

## 2020-12-12 ENCOUNTER — Other Ambulatory Visit: Payer: Medicare Other

## 2020-12-12 ENCOUNTER — Other Ambulatory Visit: Payer: Self-pay

## 2020-12-12 DIAGNOSIS — R921 Mammographic calcification found on diagnostic imaging of breast: Secondary | ICD-10-CM

## 2020-12-12 DIAGNOSIS — N6011 Diffuse cystic mastopathy of right breast: Secondary | ICD-10-CM

## 2021-03-24 ENCOUNTER — Other Ambulatory Visit: Payer: Self-pay | Admitting: Cardiology

## 2021-03-24 DIAGNOSIS — I482 Chronic atrial fibrillation, unspecified: Secondary | ICD-10-CM

## 2021-03-26 NOTE — Telephone Encounter (Signed)
Prescription refill request for Eliquis received. Indication: a fib Last office visit: 07/18/20 Scr: 12.2 Age: 51 Weight: 79kg

## 2021-04-18 ENCOUNTER — Other Ambulatory Visit: Payer: Self-pay | Admitting: Student

## 2021-04-18 DIAGNOSIS — Z1231 Encounter for screening mammogram for malignant neoplasm of breast: Secondary | ICD-10-CM

## 2021-05-08 ENCOUNTER — Ambulatory Visit: Payer: Medicare HMO

## 2021-05-22 ENCOUNTER — Ambulatory Visit: Payer: Medicaid Other

## 2021-05-29 ENCOUNTER — Ambulatory Visit
Admission: RE | Admit: 2021-05-29 | Discharge: 2021-05-29 | Disposition: A | Discharge status: Home / Self Care | Payer: Medicare Other | Source: Ambulatory Visit | Attending: Student | Admitting: Student

## 2021-05-29 ENCOUNTER — Other Ambulatory Visit: Payer: Self-pay

## 2021-05-29 DIAGNOSIS — Z1231 Encounter for screening mammogram for malignant neoplasm of breast: Secondary | ICD-10-CM

## 2021-07-17 ENCOUNTER — Ambulatory Visit (INDEPENDENT_AMBULATORY_CARE_PROVIDER_SITE_OTHER): Payer: Medicare Other | Admitting: Cardiology

## 2021-07-17 ENCOUNTER — Encounter: Payer: Self-pay | Admitting: Cardiology

## 2021-07-17 VITALS — BP 90/60 | HR 74 | Ht 69.0 in | Wt 180.0 lb

## 2021-07-17 DIAGNOSIS — I25810 Atherosclerosis of coronary artery bypass graft(s) without angina pectoris: Secondary | ICD-10-CM

## 2021-07-17 DIAGNOSIS — I480 Paroxysmal atrial fibrillation: Secondary | ICD-10-CM | POA: Diagnosis not present

## 2021-07-17 DIAGNOSIS — I050 Rheumatic mitral stenosis: Secondary | ICD-10-CM | POA: Diagnosis not present

## 2021-07-17 DIAGNOSIS — N2581 Secondary hyperparathyroidism of renal origin: Secondary | ICD-10-CM | POA: Diagnosis not present

## 2021-07-17 DIAGNOSIS — I120 Hypertensive chronic kidney disease with stage 5 chronic kidney disease or end stage renal disease: Secondary | ICD-10-CM

## 2021-07-17 NOTE — Patient Instructions (Signed)
Medication Instructions:  Your physician recommends that you continue on your current medications as directed. Please refer to the Current Medication list given to you today.  *If you need a refill on your cardiac medications before your next appointment, please call your pharmacy*   Lab Work: None If you have labs (blood work) drawn today and your tests are completely normal, you will receive your results only by: MyChart Message (if you have MyChart) OR A paper copy in the mail If you have any lab test that is abnormal or we need to change your treatment, we will call you to review the results.   Testing/Procedures: None   Follow-Up: At [facility], you and your health needs are our priority.  As part of our continuing mission to provide you with exceptional heart care, we have created designated Provider Care Teams.  These Care Teams include your primary Cardiologist (physician) and Advanced Practice Providers (APPs -  Physician Assistants and Nurse Practitioners) who all work together to provide you with the care you need, when you need it.  We recommend signing up for the patient portal called "MyChart".  Sign up information is provided on this After Visit Summary.  MyChart is used to connect with patients for Virtual Visits (Telemedicine).  Patients are able to view lab/test results, encounter notes, upcoming appointments, etc.  Non-urgent messages can be sent to your provider as well.   To learn more about what you can do with MyChart, go to [email or web address].    Your next appointment:   1 year(s)  The format for your next appointment:   In Person  Provider:   Kardie Tobb, DO     Other Instructions   Important Information About Sugar       

## 2021-07-17 NOTE — Progress Notes (Signed)
?Cardiology Office Note:   ? ?Date:  07/17/2021  ? ?ID:  Morgan Pope, DOB 1970/05/14, MRN [id number] ? ?PCP:  Morgan May, NP  ?Cardiologist:  Morgan Salines, DO  ?Electrophysiologist:  None  ? ?Referring MD: Morgan May, NP  ? ?Chief Complaint  ?Patient presents with  ? Follow-up  ?  1 year.  ? ? ?History of Present Illness:   ? ?Morgan Pope is a 51 y.o. female with a hx of paroxysmal atrial fibrillation (diagnosed in 2017 patient is currently on Eliquis and amiodarone 200 mg daily), nonischemic cardiomyopathy most recent ejection fraction in September 2019 was reported at rate of 55%, lupus which was diagnosed in her 9s, ESRD on hemodialysis Monday Wednesday and Fridays first dialysis was back in July 2000, hypertensive heart disease. ? ?She is here today for her follow-up visit.  She looks very good and is happy with the way her care has progressed.  She has gotten back on the transplant list at [facility] ? ?She denies any chest pain, shortness of breath, lightheadedness, dizziness. ? ?Past Medical History:  ?Diagnosis Date  ? Anemia in chronic kidney disease 01/21/2019  ? Anticoagulant long-term use 04/13/2019  ? 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 AV fistula occlusion, initial encounter ([place]) 04/24/2019  ? CHF (congestive heart failure) ([place])   ? Chronic atrial fibrillation ([place]) 07/14/2014  ? Cough 05/21/2019  ? Cutaneous abscess, unspecified 04/23/2019  ? Dependence on renal dialysis ([place]) 01/21/2019  ? Diarrhea, unspecified 01/21/2019  ? 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 Disorder of phosphorus metabolism, unspecified 01/21/2019  ? Dyspnea, unspecified 01/21/2019  ? 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 Encounter for therapeutic drug level monitoring 04/26/2019  ? End stage renal disease ([place]) 01/21/2019  ? ESRD (end stage renal disease) ([place]) 01/21/2019  ? Hyperkalemia 01/25/2019  ? Hypertension 04/04/2010  ?  Hypertensive chronic kidney disease with stage 5 chronic kidney disease or end stage renal disease ([place]) 01/21/2019  ? Infection and inflammatory reaction due to other cardiac and vascular devices, implants and grafts, initial encounter ([place]) 04/23/2019  ? Iron deficiency anemia, unspecified 01/21/2019  ? Mitral valve stenosis 04/13/2019  ? Nonischemic cardiomyopathy ([place]) 04/14/2015  ? EF 28%.  Followed by [employer].   EF improved to 50% with therapy.  ? Osteopenia 12/30/2011  ? On bone density 9/13  ? Other specified complication of vascular prosthetic devices, implants and grafts, initial encounter ([place]) 04/23/2019  ? Pain in left arm 04/23/2019  ? Pain, unspecified 01/21/2019  ? Personal history of COVID-19 06/07/2019  ? Pruritus, unspecified 01/21/2019  ? Renal osteodystrophy 01/21/2019  ? Secondary hyperparathyroidism of renal origin ([place]) 01/21/2019  ? 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 Unspecified disturbances of smell and taste 05/21/2019  ? Vitamin D deficiency, unspecified 01/21/2019  ? ? ?Past Surgical History:  ?Procedure Laterality Date  ? ABDOMINAL HYSTERECTOMY    ? AORTIC ARCH ANGIOGRAPHY N/A 10/04/2020  ? Procedure: AORTIC ARCH ANGIOGRAPHY;  Surgeon: Cherre Robins, MD;  Location: [facility];  Service: Cardiovascular;  Laterality: N/A;  ? BREAST BIOPSY Right 06/06/2020  ? CARDIAC SURGERY    ? Released fluid from heart  ? THYROIDECTOMY    ? UPPER EXTREMITY ANGIOGRAPHY Left 10/04/2020  ? Procedure: UPPER EXTREMITY ANGIOGRAPHY;  Surgeon: Cherre Robins, MD;  Location: [facility];  Service: Cardiovascular;  Laterality: Left;  ? ? ?Current Medications: ?Current Meds  ?  Medication Sig  ? amiodarone (PACERONE) 200 MG tablet Take 200 mg by mouth 2 (two) times daily.  ? calcium acetate (PHOSLO) 667 MG capsule Take 1,334-2,001 mg by mouth See admin instructions. Take 3 capsules (2001 mg) by mouth 3 times daily with meals & take 2 capsules (1334 mg) by mouth with  snacks  ? cholecalciferol (VITAMIN D) 25 MCG (1000 UNIT) tablet Take 1,000 Units by mouth in the morning.  ? ELIQUIS 5 MG TABS tablet TAKE 1 TABLET(5 MG) BY MOUTH TWICE DAILY  ? fluticasone (FLONASE) 50 MCG/ACT nasal spray Place 2 sprays into both nostrils 2 (two) times daily as needed for allergies.  ? gabapentin (NEURONTIN) 100 MG capsule Take 100 mg by mouth at bedtime.  ? HYDROcodone-acetaminophen (NORCO/VICODIN) 5-325 MG tablet Take 1 tablet by mouth every 4 (four) hours as needed for moderate pain.  ? midodrine (PROAMATINE) 10 MG tablet Take 1 tablet (10 mg total) by mouth 3 (three) times a week. Monday, Wednesday, Friday (Patient taking differently: Take 10 mg by mouth every Monday, Wednesday, and Friday. In the morning)  ? omeprazole (PRILOSEC) 20 MG capsule Take 20-40 mg by mouth in the morning.  ?  ? ?Allergies:   Vancomycin, Codeine, Penicillins, Fentanyl, and Phoslo [calcium acetate (phos binder)]  ? ?Social History  ? ?Socioeconomic History  ? Marital status: Divorced  ?  Spouse name: Not on file  ? Number of children: Not on file  ? Years of education: Not on file  ? Highest education level: Not on file  ?Occupational History  ? Not on file  ?Tobacco Use  ? Smoking status: Every Day  ?  Types: Cigarettes  ? Smokeless tobacco: Never  ? Tobacco comments:  ?  TRYING TO STOP  ?Substance and Sexual Activity  ? Alcohol use: Not Currently  ? Drug use: Never  ? Sexual activity: Not on file  ?Other Topics Concern  ? Not on file  ?Social History Narrative  ? Not on file  ? ?Social Determinants of Health  ? ?Financial Resource Strain: Not on file  ?Food Insecurity: Not on file  ?Transportation Needs: Not on file  ?Physical Activity: Not on file  ?Stress: Not on file  ?Social Connections: Not on file  ?  ? ?Family History: ?The patient's family history includes Congestive Heart Failure in her mother; Dementia in her mother; Diabetes in her mother and sister; Heart attack in her brother; Renal Disease in her mother  and sister. ? ?ROS:   ?Review of Systems  ?Constitution: Negative for decreased appetite, fever and weight gain.  ?HENT: Negative for congestion, ear discharge, hoarse voice and sore throat.   ?Eyes: Negative for discharge, redness, vision loss in right eye and visual halos.  ?Cardiovascular: Negative for chest pain, dyspnea on exertion, leg swelling, orthopnea and palpitations.  ?Respiratory: Negative for cough, hemoptysis, shortness of breath and snoring.   ?Endocrine: Negative for heat intolerance and polyphagia.  ?Hematologic/Lymphatic: Negative for bleeding problem. Does not bruise/bleed easily.  ?Skin: Negative for flushing, nail changes, rash and suspicious lesions.  ?Musculoskeletal: Negative for arthritis, joint pain, muscle cramps, myalgias, neck pain and stiffness.  ?Gastrointestinal: Negative for abdominal pain, bowel incontinence, diarrhea and excessive appetite.  ?Genitourinary: Negative for decreased libido, genital sores and incomplete emptying.  ?Neurological: Negative for brief paralysis, focal weakness, headaches and loss of balance.  ?Psychiatric/Behavioral: Negative for altered mental status, depression and suicidal ideas.  ?Allergic/Immunologic: Negative for HIV exposure and persistent infections.  ? ? ?EKGs/Labs/Other Studies Reviewed:   ? ?  The following studies were reviewed today: ? ? ?EKG:  The ekg ordered today demonstrates normal sinus rhythm HR 74 ? ? ?TTE 07/20/2019 IMPRESSIONS  ? 1. Left ventricular ejection fraction, by estimation, is 60 to 65%. The left ventricle has normal function. The left ventricle has no regional wall motion abnormalities. There is mild left ventricular hypertrophy. Left ventricular diastolic parameters  ?are indeterminate.  ? 2. Right ventricular systolic function is normal. The right ventricular size is normal. There is normal pulmonary artery systolic pressure.  ? 3. Left atrial size was severely dilated.  ? 4. The mitral valve is normal in structure. No  evidence of mitral valve regurgitation. Mild mitral stenosis.  ? 5. The aortic valve is normal in structure. Aortic valve regurgitation is mild. No aortic stenosis is present.  ? 6. The inferior vena cava is normal

## 2021-08-20 ENCOUNTER — Other Ambulatory Visit: Payer: Self-pay

## 2021-08-21 MED ORDER — AMIODARONE HCL 200 MG PO TABS: 200.0000 mg | ORAL_TABLET | Freq: Two times a day (BID) | ORAL | 3 refills | Status: AC

## 2021-08-29 ENCOUNTER — Other Ambulatory Visit: Payer: Self-pay | Admitting: Cardiology

## 2021-08-29 DIAGNOSIS — I482 Chronic atrial fibrillation, unspecified: Secondary | ICD-10-CM

## 2021-08-29 NOTE — Telephone Encounter (Signed)
Pt last saw Dr Harriet Masson 07/17/21, last labs 10/04/20 Creat 12.20, age 51, weight 81.6kg, based on specified criteria pt is on appropriate dosage of Eliquis 5mg  BID for afib.  Will refill rx.

## 2021-09-20 ENCOUNTER — Other Ambulatory Visit: Payer: Self-pay | Admitting: Cardiology

## 2021-09-20 DIAGNOSIS — I482 Chronic atrial fibrillation, unspecified: Secondary | ICD-10-CM

## 2021-09-20 NOTE — Telephone Encounter (Signed)
Prescription refill request for Eliquis received. Indication:Afib Last office visit:5/23 Scr:12.2 Age: 51 Weight:81.6 kg  Prescription refilled

## 2023-05-04 IMAGING — MG MM DIGITAL SCREENING BILAT W/ TOMO AND CAD
8 series · 8 of 24 positions shown · non-contrast
Comparison: Previous exam(s).

CLINICAL DATA: Screening.

EXAM:
DIGITAL SCREENING BILATERAL MAMMOGRAM WITH TOMOSYNTHESIS AND CAD
TECHNIQUE: Bilateral screening digital craniocaudal and mediolateral oblique
mammograms were obtained. Bilateral screening digital breast
tomosynthesis was performed. The images were evaluated with
computer-aided detection.

[R CC synth-2D]
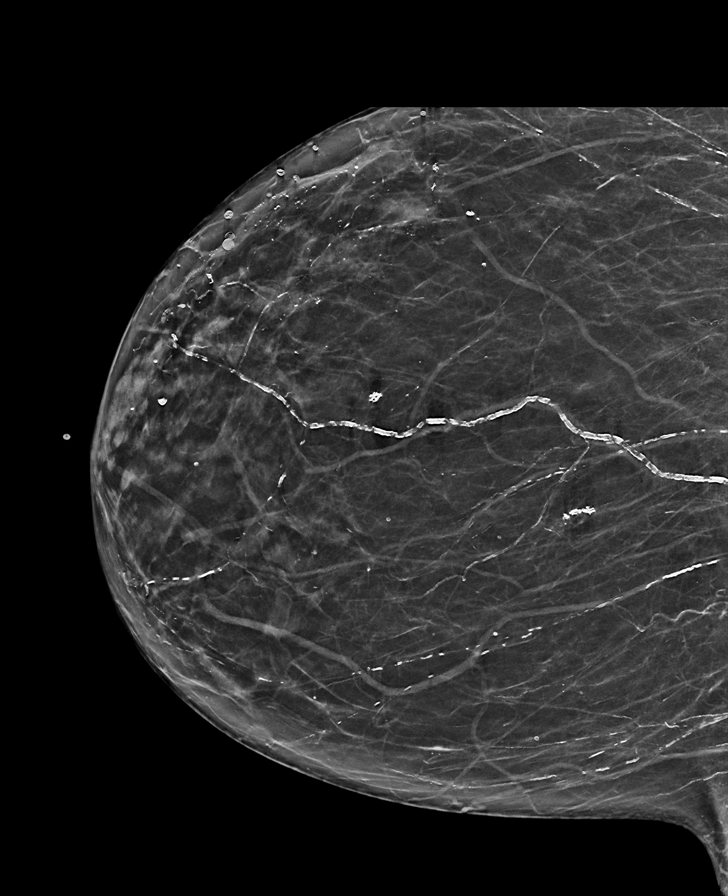

[L CC synth-2D]
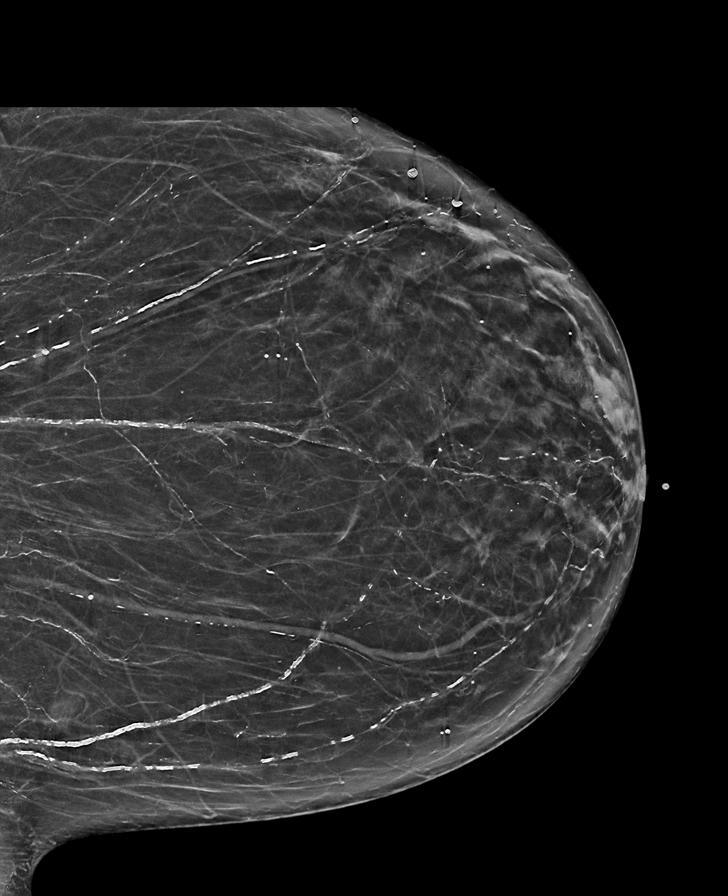

[L MLO synth-2D]
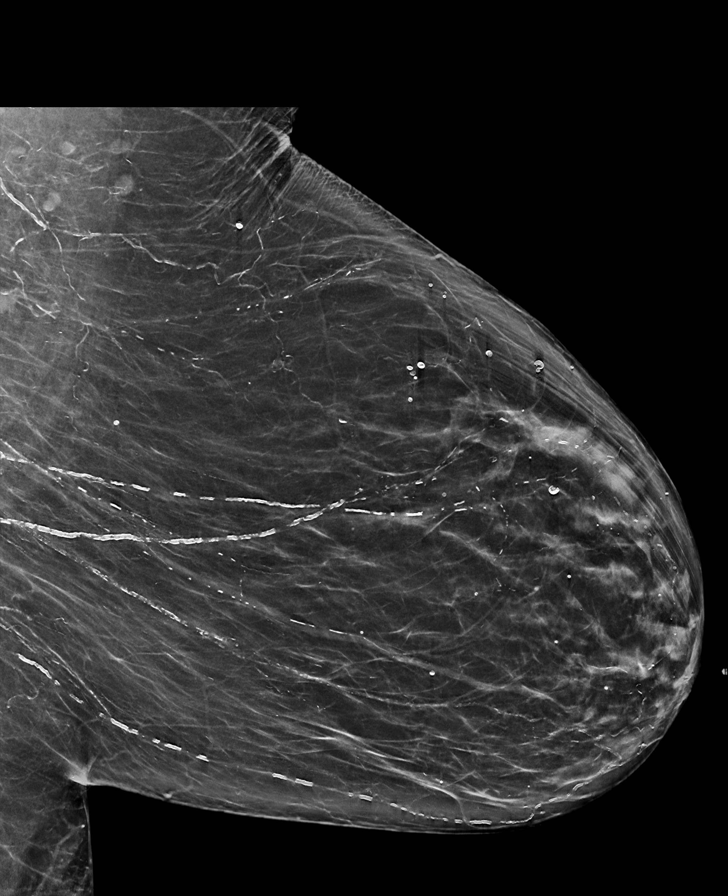

[R MLO synth-2D]
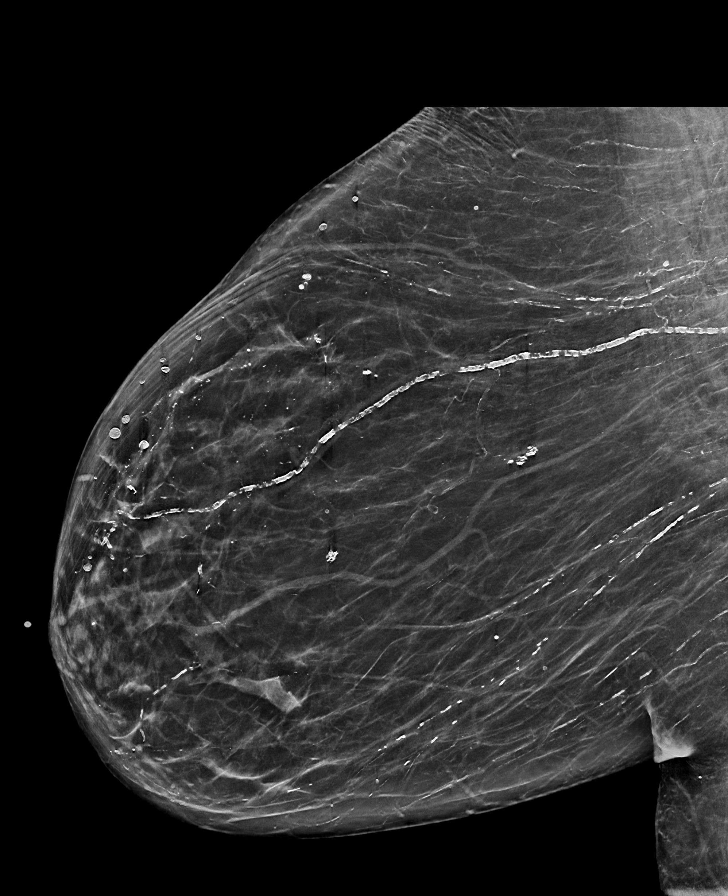

[L CC tomo · tomo slice 30/59.0]
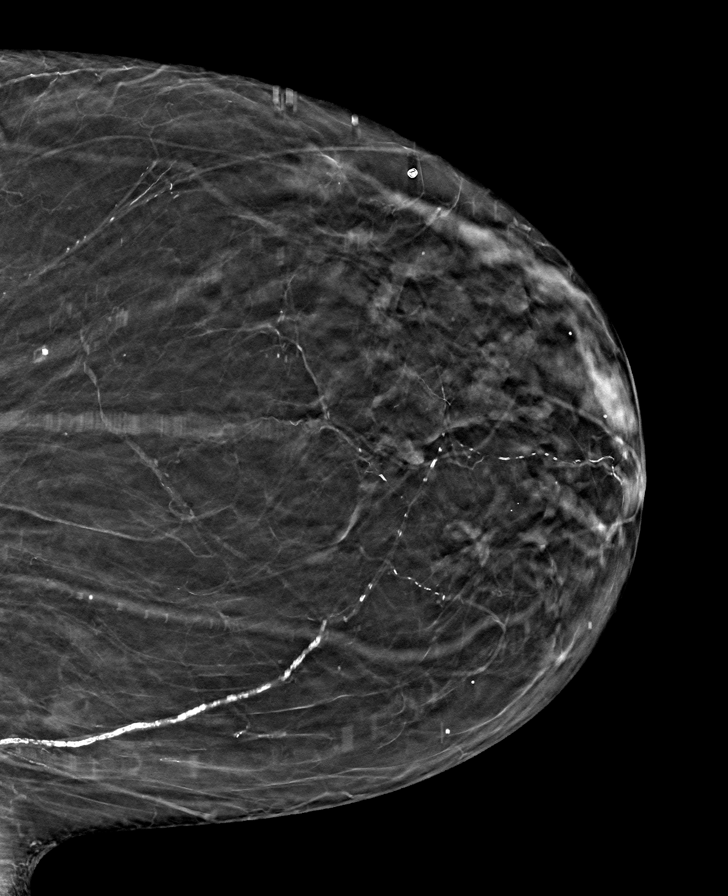

[R MLO tomo · tomo slice 37/72.0]
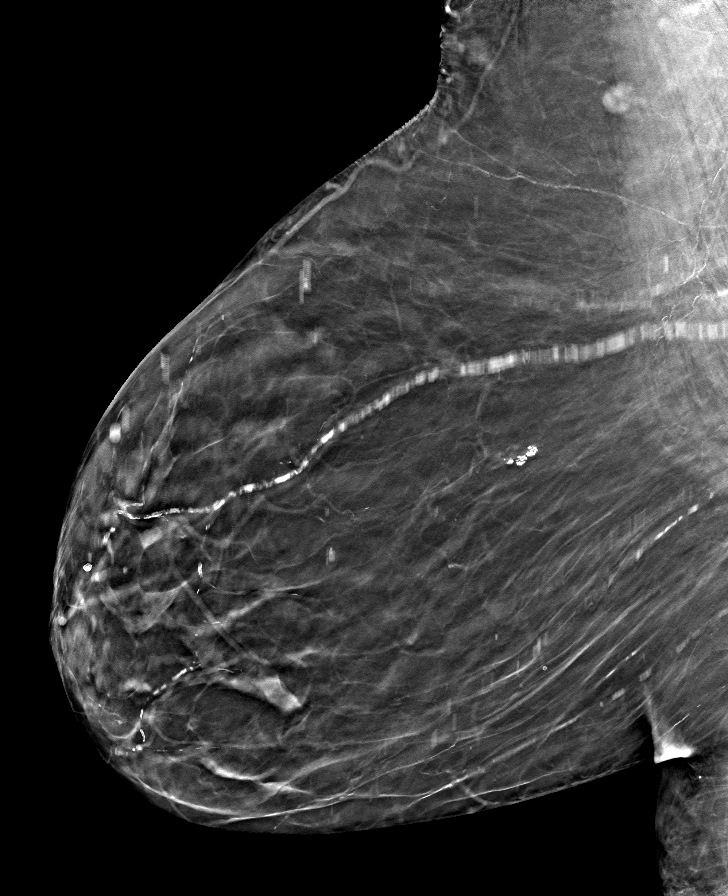

[R CC tomo · tomo slice 31/61.0]
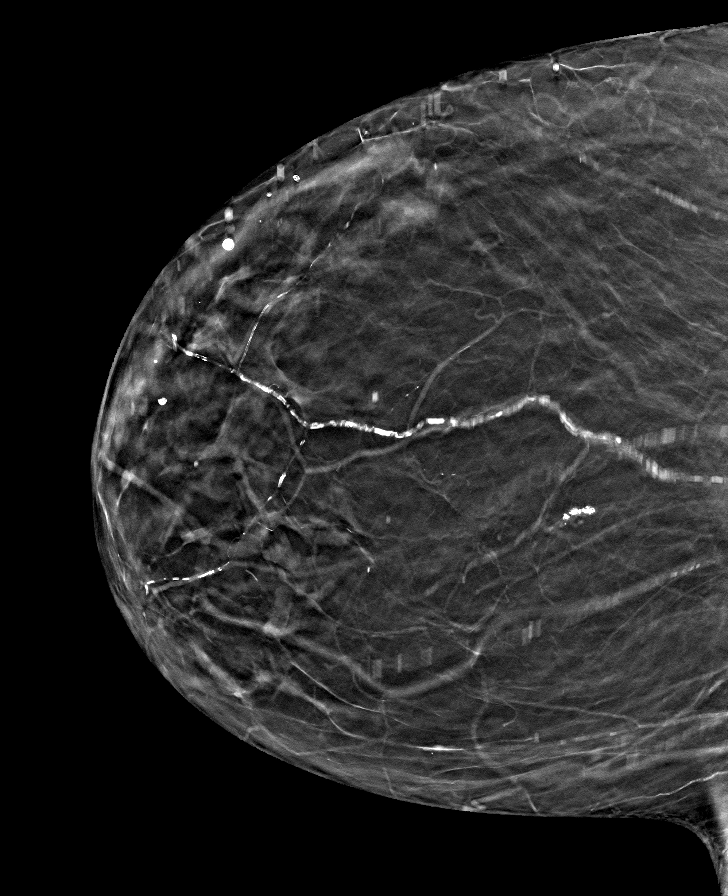

[L MLO tomo · tomo slice 38/75.0]
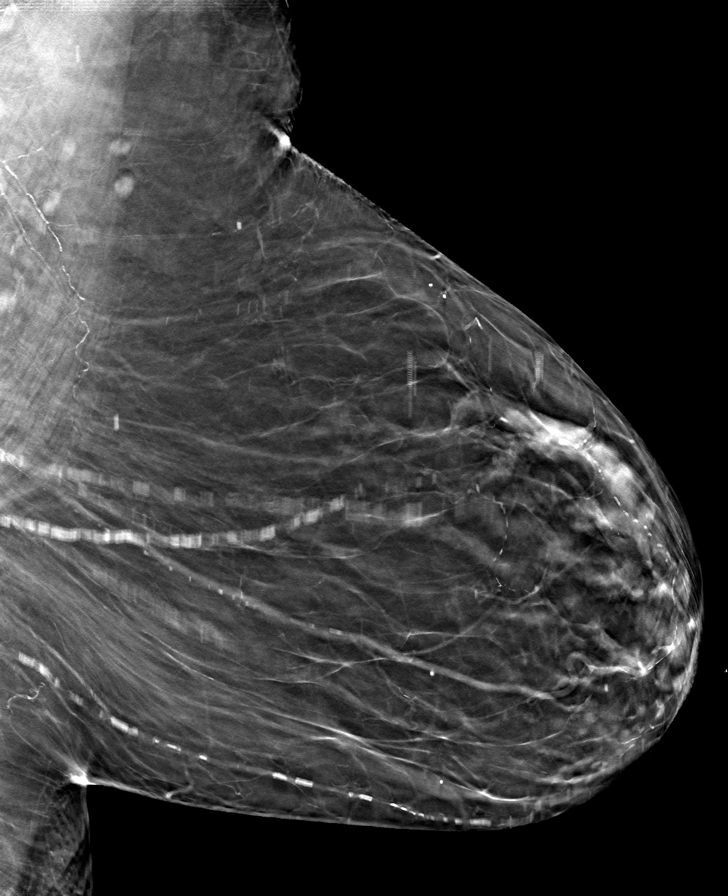

[8 of 24 positions shown; findings below may reference images not displayed]

ACR Breast Density Category b: There are scattered areas of
fibroglandular density.
FINDINGS: There are no findings suspicious for malignancy.
IMPRESSION: No mammographic evidence of malignancy. A result letter of this
screening mammogram will be mailed directly to the patient.

RECOMMENDATION:
Screening mammogram in one year. (Code:51-O-LD2)

BI-RADS CATEGORY  1: Negative.
